# Patient Record
Sex: Male | Born: 1955
Health system: Southern US, Community
[De-identification: ages and names within clinical notes are randomized; demographics above are authoritative.]

## PROBLEM LIST (undated history)

## (undated) DIAGNOSIS — M1990 Unspecified osteoarthritis, unspecified site: Secondary | ICD-10-CM

## (undated) DIAGNOSIS — I712 Thoracic aortic aneurysm, without rupture: Secondary | ICD-10-CM

## (undated) DIAGNOSIS — I209 Angina pectoris, unspecified: Secondary | ICD-10-CM

## (undated) DIAGNOSIS — K219 Gastro-esophageal reflux disease without esophagitis: Secondary | ICD-10-CM

## (undated) HISTORY — DX: Thoracic aortic aneurysm, without rupture: I71.2

## (undated) HISTORY — PX: WRIST SURGERY: SHX841

## (undated) HISTORY — PX: HERNIA REPAIR: SHX51

## (undated) HISTORY — PX: KNEE SURGERY: SHX244

## (undated) HISTORY — DX: Gastro-esophageal reflux disease without esophagitis: K21.9

## (undated) HISTORY — PX: SHOULDER SURGERY: SHX246

---

## 2001-12-11 ENCOUNTER — Ambulatory Visit (HOSPITAL_COMMUNITY): Admission: RE | Admit: 2001-12-11 | Discharge: 2001-12-11 | Payer: Self-pay | Admitting: Family Medicine

## 2001-12-11 ENCOUNTER — Encounter: Payer: Self-pay | Admitting: Family Medicine

## 2005-03-31 ENCOUNTER — Ambulatory Visit (HOSPITAL_COMMUNITY): Admission: RE | Admit: 2005-03-31 | Discharge: 2005-03-31 | Payer: Self-pay | Admitting: General Surgery

## 2005-04-05 ENCOUNTER — Ambulatory Visit (HOSPITAL_COMMUNITY): Admission: RE | Admit: 2005-04-05 | Discharge: 2005-04-05 | Payer: Self-pay | Admitting: General Surgery

## 2006-05-11 ENCOUNTER — Ambulatory Visit (HOSPITAL_COMMUNITY): Admission: RE | Admit: 2006-05-11 | Discharge: 2006-05-11 | Payer: Self-pay | Admitting: Family Medicine

## 2008-02-23 DIAGNOSIS — C801 Malignant (primary) neoplasm, unspecified: Secondary | ICD-10-CM

## 2008-02-23 HISTORY — DX: Malignant (primary) neoplasm, unspecified: C80.1

## 2009-11-12 ENCOUNTER — Emergency Department (HOSPITAL_COMMUNITY): Admission: EM | Admit: 2009-11-12 | Discharge: 2009-11-12 | Payer: Self-pay | Admitting: Emergency Medicine

## 2010-05-27 ENCOUNTER — Other Ambulatory Visit: Payer: Self-pay | Admitting: Surgery

## 2010-05-27 ENCOUNTER — Encounter (HOSPITAL_COMMUNITY): Payer: Worker's Compensation

## 2010-05-27 ENCOUNTER — Ambulatory Visit (HOSPITAL_COMMUNITY)
Admission: RE | Admit: 2010-05-27 | Discharge: 2010-05-27 | Disposition: A | Discharge status: Home / Self Care | Payer: Worker's Compensation | Source: Ambulatory Visit | Attending: Surgery | Admitting: Surgery

## 2010-05-27 ENCOUNTER — Other Ambulatory Visit (HOSPITAL_COMMUNITY): Payer: Self-pay | Admitting: Surgery

## 2010-05-27 ENCOUNTER — Other Ambulatory Visit (HOSPITAL_COMMUNITY): Payer: Self-pay

## 2010-05-27 DIAGNOSIS — Z01818 Encounter for other preprocedural examination: Secondary | ICD-10-CM | POA: Insufficient documentation

## 2010-05-27 DIAGNOSIS — Z01810 Encounter for preprocedural cardiovascular examination: Secondary | ICD-10-CM | POA: Insufficient documentation

## 2010-05-27 DIAGNOSIS — Z01812 Encounter for preprocedural laboratory examination: Secondary | ICD-10-CM | POA: Insufficient documentation

## 2010-05-27 LAB — COMPREHENSIVE METABOLIC PANEL
ALT: 14 U/L (ref 0–53)
AST: 13 U/L (ref 0–37)
Albumin: 3.8 g/dL (ref 3.5–5.2)
Alkaline Phosphatase: 107 U/L (ref 39–117)
BUN: 9 mg/dL (ref 6–23)
CO2: 25 mEq/L (ref 19–32)
Calcium: 9.1 mg/dL (ref 8.4–10.5)
Chloride: 106 mEq/L (ref 96–112)
Creatinine, Ser: 1.03 mg/dL (ref 0.4–1.5)
GFR calc Af Amer: >60 mL/min (ref >60)
GFR calc non Af Amer: >60 mL/min (ref >60)
Glucose, Bld: 107 mg/dL — ABNORMAL HIGH (ref 70–99)
Potassium: 3.6 mEq/L (ref 3.5–5.1)
Sodium: 137 mEq/L (ref 135–145)
Total Bilirubin: 0.6 mg/dL (ref 0.3–1.2)
Total Protein: 6.7 g/dL (ref 6.0–8.3)

## 2010-05-27 LAB — SURGICAL PCR SCREEN
MRSA, PCR: NEGATIVE
Staphylococcus aureus: NEGATIVE

## 2010-05-27 LAB — CBC
HCT: 45.2 % (ref 39.0–52.0)
Hemoglobin: 15.3 g/dL (ref 13.0–17.0)
MCH: 29.2 pg (ref 26.0–34.0)
MCHC: 33.8 g/dL (ref 30.0–36.0)
MCV: 86.3 fL (ref 78.0–100.0)
Platelets: 251 10*3/uL (ref 150–400)
RBC: 5.24 MIL/uL (ref 4.22–5.81)
RDW: 12.3 % (ref 11.5–15.5)
WBC: 8.4 10*3/uL (ref 4.0–10.5)

## 2010-05-28 ENCOUNTER — Ambulatory Visit (HOSPITAL_COMMUNITY)
Admission: RE | Admit: 2010-05-28 | Discharge: 2010-05-28 | Disposition: A | Discharge status: Home / Self Care | Payer: Worker's Compensation | Source: Ambulatory Visit | Attending: Surgery | Admitting: Surgery

## 2010-05-28 DIAGNOSIS — J4489 Other specified chronic obstructive pulmonary disease: Secondary | ICD-10-CM | POA: Insufficient documentation

## 2010-05-28 DIAGNOSIS — F17200 Nicotine dependence, unspecified, uncomplicated: Secondary | ICD-10-CM | POA: Insufficient documentation

## 2010-05-28 DIAGNOSIS — J449 Chronic obstructive pulmonary disease, unspecified: Secondary | ICD-10-CM | POA: Insufficient documentation

## 2010-05-28 DIAGNOSIS — D176 Benign lipomatous neoplasm of spermatic cord: Secondary | ICD-10-CM | POA: Insufficient documentation

## 2010-05-28 DIAGNOSIS — K4091 Unilateral inguinal hernia, without obstruction or gangrene, recurrent: Secondary | ICD-10-CM | POA: Insufficient documentation

## 2010-05-28 DIAGNOSIS — K429 Umbilical hernia without obstruction or gangrene: Secondary | ICD-10-CM | POA: Insufficient documentation

## 2010-05-28 DIAGNOSIS — K4090 Unilateral inguinal hernia, without obstruction or gangrene, not specified as recurrent: Secondary | ICD-10-CM | POA: Insufficient documentation

## 2010-06-10 NOTE — Op Note (Signed)
NAME:  RAEQWON, LUX NO.:  [id number]  MEDICAL RECORD NO.:  [id number]           PATIENT TYPE:  O  LOCATION:  DAYL                         FACILITY:  [facility]  PHYSICIAN:  Ardeth Sportsman, MD     DATE OF BIRTH:  [birth year]  DATE OF PROCEDURE:  05/28/2010 DATE OF DISCHARGE:  05/28/2010                              OPERATIVE REPORT   PRIMARY CARE PHYSICIAN:  [facility], Modena Nunnery, physician assistant.  Also, seen by [employer] and [employer].  SURGEON:  Ardeth Sportsman, MD  ASSISTANT:  RN, first assistant.  PREOPERATIVE DIAGNOSES: 1. Left inguinal hernia. 2. Possible right recurrent inguinal hernia.  POSTOPERATIVE DIAGNOSES: 1. Left direct inguinal hernia. 2. Small indirect, greater than direct inguinal hernia recurrences. 3. Umbilical hernia.  PROCEDURE PERFORMED: 1. Laparoscopic left inguinal hernia repair with mesh. 2. Laparoscopic repair of recurrent right inguinal hernia with mesh. 3. Primary umbilical hernia repair.  ANESTHESIA: 1. General anesthesia. 2. Bilateral ilioinguinal/genitofemoral/spermatic cord nerve blocks.  SPECIMENS:  None.  DRAINS:  None.  ESTIMATED BLOOD LOSS:  Minimal.  COMPLICATIONS:  None.  INDICATION:  Mr. Dauber is a 55 year old pleasant gentleman who noted an episode of severe groin pain, especially on the left side, after doing significant lifting.  He was sent to me to have a definite left inguinal hernia repair.  He may have had a subtle defect on the right side as well with a mild discomfort there.  Anatomy and physiology of abdominal formation was discussed and embryology of testicular migration was explained.  Pathophysiology of inguinal herniation with its natural history and risks of incarceration and strangulation were discussed.  Options discussed.  Recommendations made for diagnostic laparoscopic with repair of left, possible right inguinal hernias.  Risks, benefits, and  alternatives were discussed. Questions answered and he agreed to proceed.  OPERATIVE FINDINGS:  He had an obvious direct inguinal hernia on the left side.  It was not incarcerated.  On the right, he had a mesh in place.  The mesh had contracted a little bit around the corners and seem to be covering most of the internal ring, but had a subtle recurrence laterally and a subtle new hernia in the direct space.  He had a 1 cm umbilical hernia through the umbilical stalk.  DESCRIPTION OF PROCEDURE:  Informed consent was confirmed.  The patient underwent general anesthesia without any difficulty.  He had voided just prior to coming to the operating room.  He received IV antibiotics.  He was positioned supine with arms tucked.  His abdomen and mons pubis were clipped, prepped, and draped in a sterile fashion.  Surgical time-out confirmed our plan.  I placed a 10 mm port infraumbilically through a curvilinear incision. I placed it in the preperitoneal plane posterior to the right rectus muscle.  I induced carbon dioxide insufflation.  I freed the peritoneum off bilateral lower quadrants.  I created enough working space such that 5 mm ports could be placed in the right, left, and mid-abdomen.  Attention was turned towards the left side.  I freed the peritoneum off laterally towards the  flank.  I followed the peritoneum up towards the pubic rim.  I could see a swath of fat and peritoneum going up into a space medial to the inferior epigastric, consistent with a probable direct hernia.  After doing some further skeletonization, I could find a direct hernia and gradually reduced that down to find an intact direct hernia sac.  I could see a swath of peritoneum climbing up with the spermatic cord vessels and I skeletonized that off and peeled that back.  It did not get into the internal ring and although it was mildly dilated, there was no definite indirect inguinal hernia.  He had a few cord  lipomas associated, especially with direct hernia, and I morcellated and removed those.  I __________ hernia sac more proximally.  I freed off the psoas and freed off laterally and freed off the peritoneum off the midabdomen as well.  I turned attention towards the right side.  I did dissection in a mirror- image fashion.  However, in this case, the direct space defect was more subtle.  He did have dense adhesions to old mesh that was in the preperitoneal space.  It had contracted into a ball.  I did sharply free the hernia sac off and it have to open hernia sac anteriorly and then trimmed that off the mesh to safely peel that off.  I could see the vas deferens and splenic vessels on the right side and kept those protected as I peeled the hernia sac back off proximally.  I trimmed off excess hernia sac and did a high ligation using 3-0 Vicryl laparoscopic intracorporeal suturing.  I chose Ultra lightweight polypropylene mesh (Ultrapro).  I chose a 15 x 15 cm square.  I cut a slit in it and placed it such that there were overlapping diamonds with a 6 x 6 inch medial and inferior flap resting between the 2 pelvic side walls and the lateral bladder wall.  The medial corners overlapped across the midline.  This provided excellent coverage around the internal rings and direct space hernias and femoral and obturator foramina.  I __________left and evacuated capnoperitoneum and removed the ports.  I closed the fascial defect.  I noted an umbilical hernia through the umbilical stalk and I closed that using 0-Vicryl interrupted stitches x3.  The patient was extubated and taken to the recovery room in stable condition.  I had discussed postoperative care with the patient in the office.  I did discuss it again with him just prior to surgery.  I am going to discuss it with his significant other as well.     Ardeth Sportsman, MD     SCG/MEDQ  D:  05/28/2010  T:  05/29/2010  Job:   [id number]  cc:   [facility]  [employer]  Electronically Signed by Karie Soda MD on 06/10/2010 11:31:27 AM

## 2010-06-23 ENCOUNTER — Encounter (INDEPENDENT_AMBULATORY_CARE_PROVIDER_SITE_OTHER): Payer: Self-pay | Admitting: Surgery

## 2010-07-10 NOTE — H&P (Signed)
NAME:  Matthew Dickerson, Matthew Dickerson               ACCOUNT NO.:  [id number]   MEDICAL RECORD NO.:  [id number]           PATIENT TYPE:  AMB   LOCATION:                                FACILITY:  [facility]   PHYSICIAN:  Dalia Heading, M.D.  DATE OF BIRTH:  1955-07-13   DATE OF ADMISSION:  DATE OF DISCHARGE:  LH                                HISTORY & PHYSICAL   CHIEF COMPLAINT:  Right inguinal hernia.   HISTORY OF PRESENT ILLNESS:  The patient is 55 year old white male who is  referred for evaluation and treatment of right groin pain.  It has been  present for some time and is made worse with straining.  A bulge has been  noted.  No nausea or vomiting has been noted.   PAST MEDICAL HISTORY:  Unremarkable.   PAST SURGICAL HISTORY:  Includes multiple extremity surgeries.   CURRENT MEDICATIONS:  None.   ALLERGIES:  No known drug allergies.   REVIEW OF SYSTEMS:  The patient smokes a pack of cigarettes a day. Denies  any significant alcohol use.  Denies any other cardiopulmonary difficulties  or bleeding disorders.   PHYSICAL EXAMINATION:  GENERAL:  On physical examination, the patient is a  well-developed well-nourished white male in no acute distress.  LUNGS:  Clear to auscultation with equal breath sounds bilaterally.  HEART:  Heart examination reveals a regular rate and rhythm without S3, S4,  or murmurs.  ABDOMEN:  The abdomen is soft and  nondistended.  He is tender in the right  groin region to palpation.  A reducible right inguinal hernia is noted.  No  hepatosplenomegaly or masses are noted.  GENITOURINARY EXAMINATION:  Within normal limits.   IMPRESSION:  Right inguinal hernia.   PLAN:  The patient is scheduled for a right inguinal herniorrhaphy on  March 31, 2005.  The risks and benefits of the procedure including  bleeding, infection, pain, and the possibility of recurrence of the hernia  were fully explained to the patient who gave informed consent.      Dalia Heading, M.D.  Electronically Signed     MAJ/MEDQ  D:  03/25/2005  T:  03/25/2005  Job:  [id number]   cc:   [facility]  Fax: [phone]   Kirk Ruths, M.D.  Fax: [phone]

## 2010-07-10 NOTE — Op Note (Signed)
NAME:  Matthew Dickerson, Matthew Dickerson               ACCOUNT NO.:  [id number]   MEDICAL RECORD NO.:  [id number]          PATIENT TYPE:  AMB   LOCATION:  DAY                           FACILITY:  [facility]   PHYSICIAN:  Dalia Heading, M.D.  DATE OF BIRTH:  01/13/1956   DATE OF PROCEDURE:  04/05/2005  DATE OF DISCHARGE:                                 OPERATIVE REPORT   PREOPERATIVE DIAGNOSIS:  Right inguinal hernia.   POSTOPERATIVE DIAGNOSIS:  Right inguinal hernia, indirect and direct.   PROCEDURE:  Right inguinal herniorrhaphy.   SURGEON:  Dalia Heading, M.D.   ANESTHESIA:  General.   INDICATIONS:  The patient is a 55 year old white male who presents with a  symptomatic right inguinal hernia. The risks and benefits of the procedure  including bleeding, infection, pain, and possibly of the recurrence of the  hernia were fully explained to the patient and gave informed consent.   PROCEDURE NOTE:  The patient was placed in the supine position. After  general anesthesia was administered, the right groin region was prepped and  draped using the usual sterile technique with Betadine. Surgical site  confirmation was performed.   A transverse incision was made in the right groin region down to the  external oblique aponeurosis.  The aponeurosis was was incised to the  external ring. A Penrose drain was placed around the spermatic cord. The vas  deferens was noted within the spermatic cord. The ileal ilioinguinal nerve  was identified and retracted superiorly from the operative field. The  patient was noted to have both an indirect and direct hernia. The indirect  hernia was freed away from the cord and inverted at the peritoneal  reflection. A large polypropylene mesh plug was then placed into this  region.   A smaller direct hernia was found and this was incised at its base and  inverted. A medium-size polypropylene mesh plug was then placed into this  region. Both were secured using 2-0 Novofil  interrupted sutures to the  transversalis fascia. An onlay polypropylene mesh patch was then placed  along the floor of the inguinal canal and secured superiorly to the  conjoined tendon and inferiorly to the shelving edge of Poupart's ligament  using 2-0 Novofil interrupted sutures. The internal ring was recreated using  a 2-0 Novofil interrupted suture. The external oblique aponeurosis was  reapproximated using a 2-0 Vicryl running suture. The subcutaneous layer was  reapproximated using a 3-0 Vicryl interrupted suture. The skin was closed  using a 4-0 Vicryl subcuticular suture. Then 0.5 cm Sensorcaine was  instilled into the surrounding wound. Dermabond was then applied.   All tape and needle counts were correct at the end of the procedure. The  patient was awakened and transferred to PACU in stable condition.   COMPLICATIONS:  None.   SPECIMEN:  None.   BLOOD LOSS:  Minimal.      Dalia Heading, M.D.  Electronically Signed     MAJ/MEDQ  D:  04/05/2005  T:  04/05/2005  Job:  [id number]   cc:   Kirk Ruths, M.D.  Fax:  [phone] 

## 2013-01-22 ENCOUNTER — Other Ambulatory Visit (HOSPITAL_COMMUNITY): Payer: Self-pay | Admitting: Family Medicine

## 2013-01-22 ENCOUNTER — Ambulatory Visit (HOSPITAL_COMMUNITY)
Admission: RE | Admit: 2013-01-22 | Discharge: 2013-01-22 | Disposition: A | Discharge status: Home / Self Care | Payer: PRIVATE HEALTH INSURANCE | Source: Ambulatory Visit | Attending: Family Medicine | Admitting: Family Medicine

## 2013-01-22 DIAGNOSIS — M4180 Other forms of scoliosis, site unspecified: Secondary | ICD-10-CM | POA: Insufficient documentation

## 2013-01-22 DIAGNOSIS — Z0000 Encounter for general adult medical examination without abnormal findings: Secondary | ICD-10-CM

## 2013-01-22 DIAGNOSIS — M5380 Other specified dorsopathies, site unspecified: Secondary | ICD-10-CM | POA: Insufficient documentation

## 2013-01-22 DIAGNOSIS — F17200 Nicotine dependence, unspecified, uncomplicated: Secondary | ICD-10-CM | POA: Insufficient documentation

## 2013-01-22 DIAGNOSIS — J029 Acute pharyngitis, unspecified: Secondary | ICD-10-CM | POA: Insufficient documentation

## 2013-01-22 DIAGNOSIS — R0789 Other chest pain: Secondary | ICD-10-CM | POA: Insufficient documentation

## 2013-02-22 DIAGNOSIS — I739 Peripheral vascular disease, unspecified: Secondary | ICD-10-CM

## 2013-02-22 HISTORY — PX: COLONOSCOPY: SHX174

## 2013-02-22 HISTORY — DX: Peripheral vascular disease, unspecified: I73.9

## 2014-02-22 DIAGNOSIS — I712 Thoracic aortic aneurysm, without rupture, unspecified: Secondary | ICD-10-CM

## 2014-02-22 HISTORY — DX: Thoracic aortic aneurysm, without rupture: I71.2

## 2014-02-22 HISTORY — DX: Thoracic aortic aneurysm, without rupture, unspecified: I71.20

## 2015-12-08 ENCOUNTER — Other Ambulatory Visit (HOSPITAL_COMMUNITY): Payer: Self-pay | Admitting: Internal Medicine

## 2015-12-08 DIAGNOSIS — R918 Other nonspecific abnormal finding of lung field: Secondary | ICD-10-CM

## 2015-12-11 ENCOUNTER — Ambulatory Visit (HOSPITAL_COMMUNITY)
Admission: RE | Admit: 2015-12-11 | Discharge: 2015-12-11 | Disposition: A | Discharge status: Home / Self Care | Payer: BLUE CROSS/BLUE SHIELD | Source: Ambulatory Visit | Attending: Internal Medicine | Admitting: Internal Medicine

## 2015-12-11 DIAGNOSIS — R918 Other nonspecific abnormal finding of lung field: Secondary | ICD-10-CM | POA: Diagnosis not present

## 2015-12-11 DIAGNOSIS — I712 Thoracic aortic aneurysm, without rupture: Secondary | ICD-10-CM | POA: Insufficient documentation

## 2015-12-11 DIAGNOSIS — R938 Abnormal findings on diagnostic imaging of other specified body structures: Secondary | ICD-10-CM | POA: Insufficient documentation

## 2015-12-23 ENCOUNTER — Encounter: Payer: Self-pay | Admitting: Cardiothoracic Surgery

## 2015-12-24 ENCOUNTER — Institutional Professional Consult (permissible substitution) (INDEPENDENT_AMBULATORY_CARE_PROVIDER_SITE_OTHER): Payer: BLUE CROSS/BLUE SHIELD | Admitting: Surgery

## 2015-12-24 ENCOUNTER — Encounter: Payer: Self-pay | Admitting: Cardiothoracic Surgery

## 2015-12-24 VITALS — BP 144/80 | HR 75 | Resp 20 | Ht 72.0 in | Wt 244.0 lb

## 2015-12-24 DIAGNOSIS — I712 Thoracic aortic aneurysm, without rupture: Secondary | ICD-10-CM | POA: Insufficient documentation

## 2015-12-24 DIAGNOSIS — I7121 Aneurysm of the ascending aorta, without rupture: Secondary | ICD-10-CM

## 2015-12-24 NOTE — Progress Notes (Signed)
Cardiothoracic Surgery Consultation   PCP is Jana Half Referring Provider is Dwan Bolt, RN  Chief Complaint  Patient presents with  . Thoracic Aortic Aneurysm    Surgical eval, Chest CT 12/11/15,     HPI:  The patient is a 60 year old smoker who had a chest CT done 12/11/2015 after a CXR on 09/24/2015 showed a possible lung nodule. The CT scan showed an incidental 4.0 cm fusiform ascending aortic aneurysm. There were no lung nodules seen. There was also a small hepatic cyst and a 1 cm left adrenal nodule consistent with an adenoma.  Past Medical History:  Diagnosis Date  . Aortic aneurysm, thoracic ([place])   . GERD (gastroesophageal reflux disease)   On Xarelto for left superficial thrombophlebitis  Past Surgical History:  Procedure Laterality Date  . COLONOSCOPY  2015  . HERNIA REPAIR     RT SIDE  . KNEE SURGERY arthroscopies     RT-2 L-1  . SHOULDER SURGERY     RT  . WRIST SURGERY     RT    Family History  Problem Relation Age of Onset  . Cancer Father   . Heart failure Maternal Grandfather   no family history of aortic aneurysm or dissection  Social History Social History  Substance Use Topics  . Smoking status: Current Every Day Smoker 1 pk per week    Types: Cigarettes  . Smokeless tobacco: Not on file  . Alcohol use Yes    Current Outpatient Prescriptions  Medication Sig Dispense Refill  . Acetaminophen (TYLENOL PO) Take by mouth as needed.      Marland Kitchen omeprazole (PRILOSEC) 20 MG capsule Take 20 mg by mouth daily.    . rivaroxaban (XARELTO) 20 MG TABS tablet Take 20 mg by mouth daily with supper.     No current facility-administered medications for this visit.     No Known Allergies  Review of Systems  Constitutional: Negative.   HENT: Negative.   Eyes: Negative.   Respiratory: Negative.   Cardiovascular: Negative.   Gastrointestinal: Negative.   Endocrine: Negative.   Genitourinary: Negative.   Musculoskeletal: Negative.     Skin: Negative.   Allergic/Immunologic: Negative.   Neurological: Negative.   Hematological: Negative.   Psychiatric/Behavioral: Negative.     BP (!) 144/80   Pulse 75   Resp 20   Ht 6' (1.829 m)   Wt 244 lb (110.7 kg)   SpO2 96% Comment: RA  BMI 33.09 kg/m  Physical Exam  Constitutional: He is oriented to person, place, and time. He appears well-developed and well-nourished. No distress.  HENT:  Head: Normocephalic and atraumatic.  Mouth/Throat: Oropharynx is clear and moist.  Eyes: EOM are normal. Pupils are equal, round, and reactive to light.  Neck: Normal range of motion. Neck supple. No JVD present.  Cardiovascular: Normal rate, regular rhythm, normal heart sounds and intact distal pulses.   No murmur heard. Pulmonary/Chest: Effort normal and breath sounds normal. No respiratory distress.  Abdominal: Soft. Bowel sounds are normal. He exhibits no distension. There is no tenderness.  Musculoskeletal: Normal range of motion. He exhibits no edema.  Lymphadenopathy:    He has no cervical adenopathy.  Neurological: He is alert and oriented to person, place, and time. He has normal strength. No cranial nerve deficit or sensory deficit.    Diagnostic Tests:  CLINICAL DATA:  History of heavy smoking, pulmonary nodular densities seen on recent chest radiograph of 09/24/2015.  EXAM: CT CHEST WITHOUT CONTRAST  TECHNIQUE: Multidetector CT imaging of the chest was performed following the standard protocol without IV contrast.  COMPARISON:  Chest radiograph 09/24/2015  FINDINGS: Cardiovascular: Normal cardiac chambers size. Dense coronary arteriosclerosis. No pericardial effusion or pericardial thickening. 4 cm ascending aortic aneurysm.  Mediastinum/Nodes: No enlarged mediastinal or axillary lymph nodes. Thyroid gland, trachea, and esophagus demonstrate no significant findings.  Lungs/Pleura: 8 mm by 6 mm noncalcified ovoid density along the minor fissure  appears to be a focus of minimal pleural thickening when triangulated on coronal images, series 8, image 83 and series 5, image 38. Bilateral upper lobe predominant small subpleural blebs are noted. No dominant mass, pneumonic consolidation, CHF, effusion or pneumothorax.  Upper Abdomen: 1 1.8 x 1.4 cm circumscribed hypodensity in the left hepatic lobe with water attenuation of Hounsfield units consistent with a cyst. 1 cm left posterior limb adrenal nodule with Hounsfield units suggesting an adenoma (Hounsfield unit 8).  Musculoskeletal: Osteophytes noted along the dorsal spine. No acute osseous appearing abnormality.  IMPRESSION: 1. No pulmonary nodule identified. Minimal pleural thickening along the minor fissure simulating a nodule on the axial views but appears flat along the minor fissure on coronal reformats. 2. 4 cm ascending aortic aneurysm. Recommend semi-annual imaging followup by CTA or MRA and referral to cardiothoracic surgery if not already obtained. This recommendation follows 2010 ACCF/AHA/AATS/ACR/ASA/SCA/SCAI/SIR/STS/SVM Guidelines for the Diagnosis and Management of Patients With Thoracic Aortic Disease. Circulation. 2010; 121: e266-e36 3. 1 cm left adrenal nodule consistent with an adenoma. 4. 1.8 x 1.4 cm left hypodensity consistent with a hepatic cyst   Electronically Signed   By: Ashley Royalty M.D.   On: 12/11/2015 18:50   Impression:  This 60 year old gentleman has a 4.0 cm fusiform ascending aortic aneurysm of unknown duration. This is well below the 5.5 cm threshold for recommending surgical treatment. He should maintain good blood pressure control and will need a repeat CT of the chest in one year. He does have significant calcified plaque in the left main and proximal LCX and LAD coronary arteries. This does not mean that there is significant stenosis but it is a marker for the potential to develop this. He is currently asymptomatic. I reviewed the  CT scan with him and his wife and answered their questions. I strongly advised him to stop smoking and minimize his cardiac risk factors.  Plan:  I will see him back in one year with a CTA of the chest.  I spent 45 minutes performing this consultation and > 50% of this time was spent face to face counseling and coordinating the care of this patient's ascending aortic aneurysm.  Gaye Pollack, MD [facility] [phone]

## 2016-12-16 ENCOUNTER — Other Ambulatory Visit: Payer: Self-pay | Admitting: Surgery

## 2016-12-16 DIAGNOSIS — I712 Thoracic aortic aneurysm, without rupture, unspecified: Secondary | ICD-10-CM

## 2017-01-05 ENCOUNTER — Ambulatory Visit: Payer: Self-pay | Admitting: Surgery

## 2017-01-05 ENCOUNTER — Other Ambulatory Visit: Payer: Self-pay

## 2017-01-10 ENCOUNTER — Ambulatory Visit
Admission: RE | Admit: 2017-01-10 | Discharge: 2017-01-10 | Disposition: A | Discharge status: Home / Self Care | Payer: BLUE CROSS/BLUE SHIELD | Source: Ambulatory Visit | Attending: Surgery | Admitting: Surgery

## 2017-01-10 DIAGNOSIS — I712 Thoracic aortic aneurysm, without rupture, unspecified: Secondary | ICD-10-CM

## 2017-01-10 MED ORDER — IOPAMIDOL (ISOVUE-370) INJECTION 76%
75.0000 mL | Freq: Once | INTRAVENOUS | Status: AC | PRN
  Administered 2017-01-10: 75 mL via INTRAVENOUS

## 2017-01-12 ENCOUNTER — Ambulatory Visit: Payer: Self-pay | Admitting: Surgery

## 2017-01-26 ENCOUNTER — Other Ambulatory Visit: Payer: Self-pay

## 2017-01-26 ENCOUNTER — Ambulatory Visit (INDEPENDENT_AMBULATORY_CARE_PROVIDER_SITE_OTHER): Payer: BLUE CROSS/BLUE SHIELD | Admitting: Surgery

## 2017-01-26 ENCOUNTER — Encounter: Payer: Self-pay | Admitting: Surgery

## 2017-01-26 VITALS — BP 132/84 | HR 74 | Ht 72.0 in | Wt 229.0 lb

## 2017-01-26 DIAGNOSIS — I712 Thoracic aortic aneurysm, without rupture, unspecified: Secondary | ICD-10-CM

## 2017-01-28 ENCOUNTER — Encounter: Payer: Self-pay | Admitting: Surgery

## 2017-01-28 NOTE — Progress Notes (Signed)
     HPI:  The patient returns for follow up of a 4.0 cm ascending aortic aneurysm found incidentally on a CT scan of the chest done 12/11/2015 for work up of a lung nodule seen on CXR. He has felt well over the past year with no chest or back pain.  No current outpatient medications on file.   No current facility-administered medications for this visit.      Physical Exam: BP 132/84 (BP Location: Right Arm, Patient Position: Sitting, Cuff Size: Large)   Pulse 74   Ht 6' (1.829 m)   Wt 229 lb (103.9 kg)   SpO2 98% Comment: on RA  BMI 31.06 kg/m  He looks well Cardiac exam shows a regular rate and rhythm with normal heart sounds Lungs are clear  Diagnostic Tests:  CLINICAL DATA:  Thoracic aortic aneurysm without rupture.  EXAM: CT ANGIOGRAPHY CHEST WITH CONTRAST  TECHNIQUE: Multidetector CT imaging of the chest was performed using the standard protocol during bolus administration of intravenous contrast. Multiplanar CT image reconstructions and MIPs were obtained to evaluate the vascular anatomy.  CONTRAST:  39mL ISOVUE-370 IOPAMIDOL (ISOVUE-370) INJECTION 76%  COMPARISON:  CT scan of December 11, 2015.  FINDINGS: Cardiovascular: 4.2 cm ascending thoracic aortic aneurysm is noted without dissection. Great vessels are widely patent. Transverse aortic arch measures 2.8 cm 3 proximal descending thoracic aorta measures 2.9 cm. Normal cardiac size. No pericardial effusion.  Mediastinum/Nodes: No enlarged mediastinal, hilar, or axillary lymph nodes. Thyroid gland, trachea, and esophagus demonstrate no significant findings.  Lungs/Pleura: No pneumothorax or pleural effusion is noted. Stable nodular density seen in right middle lobe most consistent with pleural thickening. No significant pulmonary parenchymal abnormality is noted. Stable bilateral apical blebs are noted.  Upper Abdomen: Stable left hepatic cyst. Stable left adrenal adenoma.  Musculoskeletal:  No chest wall abnormality. No acute or significant osseous findings.  Review of the MIP images confirms the above findings.  IMPRESSION: Stable 4.2 cm ascending thoracic aortic aneurysm. Recommend annual imaging followup by CTA or MRA. This recommendation follows 2010 ACCF/AHA/AATS/ACR/ASA/SCA/SCAI/SIR/STS/SVM Guidelines for the Diagnosis and Management of Patients with Thoracic Aortic Disease. Circulation. 2010; 121: [id number].  Stable small blebs seen in both lung apices.  Stable left adrenal adenoma.   Electronically Signed   By: Marijo Conception, M.D.   On: 01/10/2017 16:00   Impression:  His ascending aortic aneurysm was measured at 4.2 cm now compared to 4.0 cm a year ago. I have personally reviewed the CT scan and compared it to last years scan. The aorta is probably about the same. I would check it again in a year. His BP is adequately controlled. I reviewed his CT scan images with him and answered his questions.  Plan:  Follow up with me in one year with a CTA of the chest.  I spent 15 minutes performing this established patient evaluation and > 50% of this time was spent face to face counseling and coordinating the care of this patient's aortic aneurysm.  Gaye Pollack, MD [facility] [phone]

## 2018-01-03 ENCOUNTER — Other Ambulatory Visit: Payer: Self-pay | Admitting: Surgery

## 2018-01-03 DIAGNOSIS — I712 Thoracic aortic aneurysm, without rupture, unspecified: Secondary | ICD-10-CM

## 2018-01-03 NOTE — Progress Notes (Unsigned)
Ct a 

## 2018-03-01 ENCOUNTER — Ambulatory Visit: Payer: Self-pay | Admitting: Surgery

## 2018-03-01 ENCOUNTER — Other Ambulatory Visit: Payer: Self-pay

## 2018-08-04 ENCOUNTER — Ambulatory Visit: Payer: Self-pay | Admitting: Orthopedic Surgery

## 2018-08-18 ENCOUNTER — Encounter (INDEPENDENT_AMBULATORY_CARE_PROVIDER_SITE_OTHER): Payer: BC Managed Care – PPO | Admitting: Orthopedic Surgery

## 2018-08-18 ENCOUNTER — Other Ambulatory Visit: Payer: Self-pay

## 2018-08-18 ENCOUNTER — Ambulatory Visit (INDEPENDENT_AMBULATORY_CARE_PROVIDER_SITE_OTHER): Payer: BC Managed Care – PPO

## 2018-08-18 VITALS — BP 166/89 | HR 73 | Temp 97.3°F | Ht 72.0 in | Wt 232.0 lb

## 2018-08-18 DIAGNOSIS — G8929 Other chronic pain: Secondary | ICD-10-CM

## 2018-08-18 DIAGNOSIS — F17200 Nicotine dependence, unspecified, uncomplicated: Secondary | ICD-10-CM

## 2018-08-18 DIAGNOSIS — M25561 Pain in right knee: Secondary | ICD-10-CM | POA: Diagnosis not present

## 2018-08-18 DIAGNOSIS — M25562 Pain in left knee: Secondary | ICD-10-CM | POA: Diagnosis not present

## 2018-08-18 NOTE — Patient Instructions (Signed)
Coping with Quitting Smoking  Quitting smoking is a physical and mental challenge. You will face cravings, withdrawal symptoms, and temptation. Before quitting, work with your health care provider to make a plan that can help you cope. Preparation can help you quit and keep you from giving in. How can I cope with cravings? Cravings usually last for 5-10 minutes. If you get through it, the craving will pass. Consider taking the following actions to help you cope with cravings:  Keep your mouth busy: ? Chew sugar-free gum. ? Suck on hard candies or a straw. ? Brush your teeth.  Keep your hands and body busy: ? Immediately change to a different activity when you feel a craving. ? Squeeze or play with a ball. ? Do an activity or a hobby, like making bead jewelry, practicing needlepoint, or working with wood. ? Mix up your normal routine. ? Take a short exercise break. Go for a quick walk or run up and down stairs. ? Spend time in public places where smoking is not allowed.  Focus on doing something kind or helpful for someone else.  Call a friend or family member to talk during a craving.  Join a support group.  Call a quit line, such as 1-800-QUIT-NOW.  Talk with your health care provider about medicines that might help you cope with cravings and make quitting easier for you. How can I deal with withdrawal symptoms? Your body may experience negative effects as it tries to get used to not having nicotine in the system. These effects are called withdrawal symptoms. They may include:  Feeling hungrier than normal.  Trouble concentrating.  Irritability.  Trouble sleeping.  Feeling depressed.  Restlessness and agitation.  Craving a cigarette. To manage withdrawal symptoms:  Avoid places, people, and activities that trigger your cravings.  Remember why you want to quit.  Get plenty of sleep.  Avoid coffee and other caffeinated drinks. These may worsen some of your symptoms.  How can I handle social situations? Social situations can be difficult when you are quitting smoking, especially in the first few weeks. To manage this, you can:  Avoid parties, bars, and other social situations where people might be smoking.  Avoid alcohol.  Leave right away if you have the urge to smoke.  Explain to your family and friends that you are quitting smoking. Ask for understanding and support.  Plan activities with friends or family where smoking is not an option. What are some ways I can cope with stress? Wanting to smoke may cause stress, and stress can make you want to smoke. Find ways to manage your stress. Relaxation techniques can help. For example:  Breathe slowly and deeply, in through your nose and out through your mouth.  Listen to soothing, relaxing music.  Talk with a family member or friend about your stress.  Light a candle.  Soak in a bath or take a shower.  Think about a peaceful place. What are some ways I can prevent weight gain? Be aware that many people gain weight after they quit smoking. However, not everyone does. To keep from gaining weight, have a plan in place before you quit and stick to the plan after you quit. Your plan should include:  Having healthy snacks. When you have a craving, it may help to: ? Eat plain popcorn, crunchy carrots, celery, or other cut vegetables. ? Chew sugar-free gum.  Changing how you eat: ? Eat small portion sizes at meals. ? Eat 4-6 small meals   throughout the day instead of 1-2 large meals a day. ? Be mindful when you eat. Do not watch television or do other things that might distract you as you eat.  Exercising regularly: ? Make time to exercise each day. If you do not have time for a long workout, do short bouts of exercise for 5-10 minutes several times a day. ? Do some form of strengthening exercise, like weight lifting, and some form of aerobic exercise, like running or swimming.  Drinking plenty of  water or other low-calorie or no-calorie drinks. Drink 6-8 glasses of water daily, or as much as instructed by your health care provider. Summary  Quitting smoking is a physical and mental challenge. You will face cravings, withdrawal symptoms, and temptation to smoke again. Preparation can help you as you go through these challenges.  You can cope with cravings by keeping your mouth busy (such as by chewing gum), keeping your body and hands busy, and making calls to family, friends, or a helpline for people who want to quit smoking.  You can cope with withdrawal symptoms by avoiding places where people smoke, avoiding drinks with caffeine, and getting plenty of rest.  Ask your health care provider about the different ways to prevent weight gain, avoid stress, and handle social situations. This information is not intended to replace advice given to you by your health care provider. Make sure you discuss any questions you have with your health care provider. Document Released: 02/06/2016 Document Revised: 02/06/2016 Document Reviewed: 02/06/2016 Elsevier Interactive Patient Education  2019 [employer]  

## 2018-08-18 NOTE — Progress Notes (Signed)
  NEW PROBLEM OFFICE VISIT  Chief Complaint  Patient presents with  . Knee Pain    Bilateral knee pain.  . Error    63 year old male bilateral knee pain bilateral x-rays were obtained I do not recall the details of this office visit   ROS   Past Medical History:  Diagnosis Date  . Aortic aneurysm, thoracic ([place])   . GERD (gastroesophageal reflux disease)     Past Surgical History:  Procedure Laterality Date  . COLONOSCOPY  2015  . HERNIA REPAIR     RT SIDE  . KNEE SURGERY     RT-2 L-1  . SHOULDER SURGERY     RT  . WRIST SURGERY     RT    Family History  Problem Relation Age of Onset  . Cancer Father   . Heart failure Maternal Grandfather    Social History   Tobacco Use  . Smoking status: Current Every Day Smoker    Packs/day: 0.75    Years: 51.00    Pack years: 38.25    Types: Cigarettes  . Smokeless tobacco: Never Used  Substance Use Topics  . Alcohol use: Yes  . Drug use: Yes    Types: Marijuana    No Known Allergies  No outpatient medications have been marked as taking for the 08/18/18 encounter (Office Visit) with Carole Civil, MD.    BP (!) 166/89   Pulse 73   Temp (!) 97.3 F (36.3 C)   Ht 6' (1.829 m)   Wt 232 lb (105.2 kg)   BMI 31.46 kg/m   Physical Exam  Ortho Exam    MEDICAL DECISION SECTION  Xrays were done at [facility] orthopedics  My independent reading of xrays:  See radiographs report  [facility] [place] imaging x-rays of the left knee   AP left knee bilateral shows severe narrowing of the medial compartment sclerosis mild to moderate secondary bone changes on axial view patella is aligned normally   Impression severe arthritis with varus moderate deformity left knee Bilateral knee x-rays taken at [facility] [place] the AP x-rays are on the left knee film series   Right knee varus alignment joint space narrowing medially no significant secondary bone changes   Impression moderate arthritis with  varus alignment to the right knee   Encounter Diagnoses  Name Primary?  . Smoking addiction Yes  . Chronic pain of both knees   . Erroneous encounter - disregard     PLAN: (Rx., injectx, surgery, frx, mri/ct) Again details of visit has been lost not sure what was done in this case patient was advised to quit smoking may have been given injections no charges will be filed  No orders of the defined types were placed in this encounter.   Arther Abbott, MD  09/11/2018 4:24 PM

## 2018-09-01 ENCOUNTER — Ambulatory Visit: Payer: Self-pay | Admitting: Orthopedic Surgery

## 2018-09-08 NOTE — Progress Notes (Signed)
This encounter was created in error - please disregard.

## 2018-09-26 ENCOUNTER — Other Ambulatory Visit: Payer: Self-pay

## 2018-09-26 DIAGNOSIS — Z20822 Contact with and (suspected) exposure to covid-19: Secondary | ICD-10-CM

## 2018-09-27 LAB — NOVEL CORONAVIRUS, NAA: SARS-CoV-2, NAA: NOT DETECTED

## 2018-09-28 ENCOUNTER — Telehealth: Payer: Self-pay

## 2018-09-28 NOTE — Telephone Encounter (Signed)
Pt. Called back, given COVID 19 results.

## 2019-01-25 ENCOUNTER — Other Ambulatory Visit: Payer: Self-pay | Admitting: Surgery

## 2019-01-25 DIAGNOSIS — I712 Thoracic aortic aneurysm, without rupture, unspecified: Secondary | ICD-10-CM

## 2019-02-05 ENCOUNTER — Other Ambulatory Visit: Payer: Self-pay

## 2019-02-05 ENCOUNTER — Ambulatory Visit (INDEPENDENT_AMBULATORY_CARE_PROVIDER_SITE_OTHER): Payer: BC Managed Care – PPO | Admitting: Orthopedic Surgery

## 2019-02-05 ENCOUNTER — Encounter: Payer: Self-pay | Admitting: Orthopedic Surgery

## 2019-02-05 VITALS — BP 143/86 | HR 104 | Ht 72.0 in | Wt 234.0 lb

## 2019-02-05 DIAGNOSIS — M170 Bilateral primary osteoarthritis of knee: Secondary | ICD-10-CM | POA: Diagnosis not present

## 2019-02-05 MED ORDER — MELOXICAM 7.5 MG PO TABS: 7.5000 mg | ORAL_TABLET | Freq: Every day | ORAL | 5 refills | Status: DC

## 2019-02-05 NOTE — Patient Instructions (Signed)

## 2019-02-05 NOTE — Progress Notes (Signed)
Matthew Dickerson  02/05/2019  Body mass index is 31.74 kg/m.   HISTORY SECTION :  Chief Complaint  Patient presents with  . Knee Pain    bilateral knees pain since 1992    This is a 63 year old male with a 4.0 cm abdominal aortic aneurysm, history of smoking, presents with history of 3 arthroscopies in the right knee 2 in the left complaining of bilateral knee pain left worse than right with progressive varus deformity.  He describes his pain as moderate to severe and as a dull ache worse with weightbearing  No prior treatment  Review of Systems  Respiratory: Negative for shortness of breath.   Cardiovascular: Negative for chest pain.  Neurological: Positive for tingling.     has a past medical history of Aortic aneurysm, thoracic ([facility]) and GERD (gastroesophageal reflux disease).   Past Surgical History:  Procedure Laterality Date  . COLONOSCOPY  2015  . HERNIA REPAIR     RT SIDE  . KNEE SURGERY     RT-2 L-1  . SHOULDER SURGERY     RT  . WRIST SURGERY     RT    Body mass index is 31.74 kg/m.   No Known Allergies   Current Outpatient Medications:  .  meloxicam (MOBIC) 7.5 MG tablet, Take 1 tablet (7.5 mg total) by mouth daily., Disp: 30 tablet, Rfl: 5   PHYSICAL EXAM SECTION: 1) BP (!) 143/86   Pulse (!) 104   Ht 6' (1.829 m)   Wt 234 lb (106.1 kg)   BMI 31.74 kg/m   Body mass index is 31.74 kg/m. General appearance: Well-developed well-nourished no gross deformities  2) Cardiovascular normal pulse and perfusion in the lower  extremities normal color without edema  3) Neurologically deep tendon reflexes are equal and normal, no sensation loss or deficits no pathologic reflexes  4) Psychological: Awake alert and oriented x3 mood and affect normal  5) Skin no lacerations or ulcerations no nodularity no palpable masses, no erythema or nodularity  6) Musculoskeletal:   Right Knee Exam   Muscle Strength  The patient has normal right knee  strength.  Tenderness  The patient is experiencing tenderness in the medial joint line.  Range of Motion  Extension: 5  Right knee flexion: 125.   Tests  McMurray:  Medial - negative  Drawer:  Anterior - negative    Posterior - negative  Other  Swelling: none Effusion: no effusion present  Comments:  Varus    Left Knee Exam   Muscle Strength  The patient has normal left knee strength.  Tenderness  The patient is experiencing tenderness in the medial joint line.  Range of Motion  Extension: 5  Left knee flexion: 125.   Tests  McMurray:  Medial - negative  Drawer:  Anterior - negative     Posterior - negative  Other  Swelling: none Effusion: no effusion present  Comments:  Varus        MEDICAL DECISION SECTION:  Encounter Diagnosis  Name Primary?  . Primary osteoarthritis of both knees Yes    Imaging Knee films 6 months ago see report   Bilateral varus knees severe oa both knees   Plan:  (Rx., Inj., surg., Frx, MRI/CT, XR:2)  Procedure note for bilateral knee injections  Procedure note left knee injection verbal consent was obtained to inject left knee joint  Timeout was completed to confirm the site of injection  The medications used were 40 mg of Depo-Medrol and  1% lidocaine 3 cc  Anesthesia was provided by ethyl chloride and the skin was prepped with alcohol.  After cleaning the skin with alcohol a 20-gauge needle was used to inject the left knee joint. There were no complications. A sterile bandage was applied.   Procedure note right knee injection verbal consent was obtained to inject right knee joint  Timeout was completed to confirm the site of injection  The medications used were 40 mg of Depo-Medrol and 1% lidocaine 3 cc  Anesthesia was provided by ethyl chloride and the skin was prepped with alcohol.  After cleaning the skin with alcohol a 20-gauge needle was used to inject the right knee joint. There were no complications. A  sterile bandage was applied.   Meds ordered this encounter  Medications  . meloxicam (MOBIC) 7.5 MG tablet    Sig: Take 1 tablet (7.5 mg total) by mouth daily.    Dispense:  30 tablet    Refill:  5    Fu 6 months   High surgical risks due to the aneurysm the smoking history history of clotting  5:10 PM Arther Abbott, MD  02/05/2019

## 2019-02-23 DIAGNOSIS — I219 Acute myocardial infarction, unspecified: Secondary | ICD-10-CM

## 2019-02-23 HISTORY — DX: Acute myocardial infarction, unspecified: I21.9

## 2019-03-06 ENCOUNTER — Other Ambulatory Visit: Payer: Self-pay | Admitting: Surgery

## 2019-03-06 DIAGNOSIS — I712 Thoracic aortic aneurysm, without rupture, unspecified: Secondary | ICD-10-CM

## 2019-03-07 ENCOUNTER — Other Ambulatory Visit: Payer: Self-pay

## 2019-03-07 ENCOUNTER — Ambulatory Visit
Admission: RE | Admit: 2019-03-07 | Discharge: 2019-03-07 | Disposition: A | Discharge status: Home / Self Care | Payer: BC Managed Care – PPO | Source: Ambulatory Visit | Attending: Surgery | Admitting: Surgery

## 2019-03-07 ENCOUNTER — Ambulatory Visit: Payer: Self-pay | Admitting: Surgery

## 2019-03-07 ENCOUNTER — Other Ambulatory Visit: Payer: Self-pay | Admitting: Surgery

## 2019-03-07 ENCOUNTER — Encounter: Payer: Self-pay | Admitting: Surgery

## 2019-03-07 DIAGNOSIS — I712 Thoracic aortic aneurysm, without rupture, unspecified: Secondary | ICD-10-CM

## 2019-03-08 LAB — CREATININE WITH EST GFR
Creat: 1.07 mg/dL (ref 0.70–1.25)
GFR, Est African American: 85 mL/min/{1.73_m2} (ref >=60)
GFR, Est Non African American: 73 mL/min/{1.73_m2} (ref >=60)

## 2019-03-08 LAB — BUN: BUN: 13 mg/dL (ref 7–25)

## 2019-03-14 ENCOUNTER — Ambulatory Visit
Admission: RE | Admit: 2019-03-14 | Discharge: 2019-03-14 | Disposition: A | Discharge status: Home / Self Care | Payer: BC Managed Care – PPO | Source: Ambulatory Visit | Attending: Surgery | Admitting: Surgery

## 2019-03-14 ENCOUNTER — Ambulatory Visit (INDEPENDENT_AMBULATORY_CARE_PROVIDER_SITE_OTHER): Payer: BC Managed Care – PPO | Admitting: Surgery

## 2019-03-14 ENCOUNTER — Encounter: Payer: Self-pay | Admitting: Surgery

## 2019-03-14 ENCOUNTER — Encounter: Payer: Self-pay | Admitting: *Deleted

## 2019-03-14 ENCOUNTER — Other Ambulatory Visit: Payer: Self-pay

## 2019-03-14 VITALS — BP 157/80 | HR 80 | Resp 18 | Ht 72.0 in | Wt 232.0 lb

## 2019-03-14 DIAGNOSIS — I712 Thoracic aortic aneurysm, without rupture, unspecified: Secondary | ICD-10-CM

## 2019-03-14 MED ORDER — IOPAMIDOL (ISOVUE-370) INJECTION 76%
75.0000 mL | Freq: Once | INTRAVENOUS | Status: AC | PRN
  Administered 2019-03-14: 75 mL via INTRAVENOUS

## 2019-03-14 NOTE — Progress Notes (Signed)
HPI:  The patient returns today for follow-up of a small ascending aortic aneurysm that was measured at 4.0 cm in 2017 and 4.2 cm in 2018.  I was supposed see him back last year for follow-up but he said that he changed jobs and had to skip his appointment.  Since I last saw him 2 years ago he has been feeling well.  He denies any chest or back pain.  He continues to smoke intermittently.  Current Outpatient Medications  Medication Sig Dispense Refill  . meloxicam (MOBIC) 7.5 MG tablet Take 1 tablet (7.5 mg total) by mouth daily. 30 tablet 5   No current facility-administered medications for this visit.     Physical Exam: BP (!) 157/80   Pulse 80   Resp 18   Ht 6' (1.829 m)   Wt 232 lb (105.2 kg)   SpO2 97% Comment: on RA  BMI 31.46 kg/m  He looks well. Cardiac exam shows a regular rate and rhythm with normal heart sounds. Lungs are clear.  Diagnostic Tests:  CLINICAL DATA:  Follow-up thoracic aortic aneurysm  EXAM: CT ANGIOGRAPHY CHEST WITH CONTRAST  TECHNIQUE: Multidetector CT imaging of the chest was performed using the standard protocol during bolus administration of intravenous contrast. Multiplanar CT image reconstructions and MIPs were obtained to evaluate the vascular anatomy.  CONTRAST:  68mL ISOVUE-370 IOPAMIDOL (ISOVUE-370) INJECTION 76%  COMPARISON:  01/10/2017  FINDINGS: Cardiovascular: Preferential opacification of the thoracic aorta. No thoracic aortic dissection. Ascending thoracic aorta measures 4.2 cm in maximum diameter at the level of the right main pulmonary artery. Aortic arch measures 2.8 cm in diameter. Proximal descending aorta at the level of the left main pulmonary artery measures 2.8 cm in diameter. Major vessels arising from the aortic arch are patent. Mild thoracic aortic atherosclerosis. Normal heart size. No pericardial effusion. Coronary artery atherosclerosis.  Mediastinum/Nodes: No enlarged mediastinal, hilar, or  axillary lymph nodes. Thyroid gland, trachea, and esophagus demonstrate no significant findings.  Lungs/Pleura: 7 mm nodular thickening along the minor fissure likely postinflammatory and unchanged compared with 12/11/2015. No focal consolidation, pleural effusion or pneumothorax.  Upper Abdomen: No acute abnormality.  Musculoskeletal: No chest wall abnormality. No acute or significant osseous findings.  Review of the MIP images confirms the above findings.  IMPRESSION: 1. Ascending thoracic aortic aneurysm measuring 4.2 cm. Recommend annual imaging followup by CTA or MRA. This recommendation follows 2010 ACCF/AHA/AATS/ACR/ASA/SCA/SCAI/SIR/STS/SVM Guidelines for the Diagnosis and Management of Patients with Thoracic Aortic Disease. Circulation. 2010; [id number]. Aortic aneurysm NOS (ICD10-I71.9) 2. No thoracic aortic dissection. 3.   Aortic Atherosclerosis (ICD10-I70.0).   Electronically Signed   By: Kathreen Devoid   On: 03/14/2019 12:41   Impression:  This 64 year old gentleman has a stable 4.2 cm fusiform ascending aortic aneurysm.  It is still well below the surgical threshold of 5.5 cm.  I reviewed the CT images with the patient and his wife and answered their questions.  I stressed the importance of good blood pressure control and preventing further enlargement and acute aortic dissection.  His blood pressure is mildly elevated today and he is on no medication.  I advised him to check his blood pressure at home periodically and to keep a chart so that when he returns to see his PCP a decision can made about the need for antihypertensive therapy.  I advised him against doing heavy lifting of more than 35 pounds that might lead to a Valsalva maneuver and sudden rise in his blood pressure.  He requested a note for his employer stating the same.  I recommend that he have a follow-up CT scan in 1 year.  Plan:  He will return to see me in 1 year with a CTA of the  chest.  I spent 15 minutes performing this established patient evaluation and > 50% of this time was spent face to face counseling and coordinating the care of this patient's aortic aneurysm.    Gaye Pollack, MD [facility] [phone]

## 2019-04-02 ENCOUNTER — Other Ambulatory Visit: Payer: Self-pay

## 2019-04-02 ENCOUNTER — Ambulatory Visit: Payer: BC Managed Care – PPO | Attending: Internal Medicine

## 2019-04-02 DIAGNOSIS — Z20822 Contact with and (suspected) exposure to covid-19: Secondary | ICD-10-CM | POA: Diagnosis not present

## 2019-04-03 LAB — NOVEL CORONAVIRUS, NAA: SARS-CoV-2, NAA: NOT DETECTED

## 2019-05-07 ENCOUNTER — Telehealth: Payer: Self-pay | Admitting: Orthopedic Surgery

## 2019-05-07 NOTE — Telephone Encounter (Signed)
Called patient to notify his form for disability parking placard is ready. No answer at home phone; voice mail full on cell number; unable to leave message.

## 2019-07-17 ENCOUNTER — Telehealth: Payer: Self-pay | Admitting: Orthopedic Surgery

## 2019-07-17 NOTE — Telephone Encounter (Signed)
Patient's designated contact on file, spouse Magda Paganini, inquired about picking up films and reports to send to another provider. Discussed process of patient signing authorization form, however, due to patient needing to request films at [facility] radiology department, patient will likely take care of all there.

## 2019-07-24 DIAGNOSIS — G8929 Other chronic pain: Secondary | ICD-10-CM | POA: Insufficient documentation

## 2019-07-24 DIAGNOSIS — M170 Bilateral primary osteoarthritis of knee: Secondary | ICD-10-CM | POA: Diagnosis not present

## 2019-08-04 DIAGNOSIS — Z0001 Encounter for general adult medical examination with abnormal findings: Secondary | ICD-10-CM | POA: Diagnosis not present

## 2019-08-06 ENCOUNTER — Ambulatory Visit: Payer: BC Managed Care – PPO | Admitting: Orthopedic Surgery

## 2019-08-06 DIAGNOSIS — Z0001 Encounter for general adult medical examination with abnormal findings: Secondary | ICD-10-CM | POA: Diagnosis not present

## 2019-08-06 DIAGNOSIS — J019 Acute sinusitis, unspecified: Secondary | ICD-10-CM | POA: Diagnosis not present

## 2019-08-06 DIAGNOSIS — M25569 Pain in unspecified knee: Secondary | ICD-10-CM | POA: Diagnosis not present

## 2019-08-06 DIAGNOSIS — Z0000 Encounter for general adult medical examination without abnormal findings: Secondary | ICD-10-CM | POA: Diagnosis not present

## 2019-08-09 DIAGNOSIS — M25562 Pain in left knee: Secondary | ICD-10-CM | POA: Diagnosis not present

## 2019-08-09 DIAGNOSIS — M179 Osteoarthritis of knee, unspecified: Secondary | ICD-10-CM | POA: Diagnosis not present

## 2019-09-15 ENCOUNTER — Encounter (HOSPITAL_COMMUNITY): Payer: Self-pay | Admitting: *Deleted

## 2019-09-15 ENCOUNTER — Emergency Department (HOSPITAL_COMMUNITY)
Admission: EM | Admit: 2019-09-15 | Discharge: 2019-09-15 | Disposition: A | Discharge status: Home / Self Care | Payer: BC Managed Care – PPO | Attending: Emergency Medicine | Admitting: Emergency Medicine

## 2019-09-15 ENCOUNTER — Other Ambulatory Visit: Payer: Self-pay

## 2019-09-15 ENCOUNTER — Emergency Department (HOSPITAL_COMMUNITY): Payer: BC Managed Care – PPO

## 2019-09-15 DIAGNOSIS — Y929 Unspecified place or not applicable: Secondary | ICD-10-CM | POA: Diagnosis not present

## 2019-09-15 DIAGNOSIS — F1210 Cannabis abuse, uncomplicated: Secondary | ICD-10-CM | POA: Diagnosis not present

## 2019-09-15 DIAGNOSIS — S50812A Abrasion of left forearm, initial encounter: Secondary | ICD-10-CM | POA: Diagnosis not present

## 2019-09-15 DIAGNOSIS — S0003XA Contusion of scalp, initial encounter: Secondary | ICD-10-CM | POA: Insufficient documentation

## 2019-09-15 DIAGNOSIS — M542 Cervicalgia: Secondary | ICD-10-CM | POA: Diagnosis not present

## 2019-09-15 DIAGNOSIS — Y999 Unspecified external cause status: Secondary | ICD-10-CM | POA: Diagnosis not present

## 2019-09-15 DIAGNOSIS — S199XXA Unspecified injury of neck, initial encounter: Secondary | ICD-10-CM | POA: Diagnosis not present

## 2019-09-15 DIAGNOSIS — T07XXXA Unspecified multiple injuries, initial encounter: Secondary | ICD-10-CM

## 2019-09-15 DIAGNOSIS — Y9355 Activity, bike riding: Secondary | ICD-10-CM | POA: Diagnosis not present

## 2019-09-15 DIAGNOSIS — F17210 Nicotine dependence, cigarettes, uncomplicated: Secondary | ICD-10-CM | POA: Insufficient documentation

## 2019-09-15 DIAGNOSIS — S0000XA Unspecified superficial injury of scalp, initial encounter: Secondary | ICD-10-CM | POA: Diagnosis not present

## 2019-09-15 DIAGNOSIS — S0990XA Unspecified injury of head, initial encounter: Secondary | ICD-10-CM | POA: Insufficient documentation

## 2019-09-15 DIAGNOSIS — S0001XA Abrasion of scalp, initial encounter: Secondary | ICD-10-CM | POA: Diagnosis not present

## 2019-09-15 DIAGNOSIS — S40812A Abrasion of left upper arm, initial encounter: Secondary | ICD-10-CM | POA: Diagnosis not present

## 2019-09-15 MED ORDER — BACITRACIN ZINC 500 UNIT/GM EX OINT
TOPICAL_OINTMENT | Freq: Once | CUTANEOUS | Status: AC
Start: 2019-09-15 — End: 2019-09-15
  Administered 2019-09-15: 1 via TOPICAL
  Filled 2019-09-15: qty 0.9
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
## 2019-09-15 NOTE — ED Notes (Signed)
Ice packs given.

## 2019-09-15 NOTE — ED Notes (Signed)
Dressing placed per pa instructions, pt reports 3/10 pain, states that he is ready to go home, pt verbalized d/c instructions and follow up, advised to have someone watch him and to return for concerns, confusion, vomiting or other concerning symptoms.
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
## 2019-09-15 NOTE — Discharge Instructions (Signed)
As we discussed, your CT scan showed a scalp hematoma.  No other bleeding in the brain or any other injury.  As we discussed, you could still have concussion.  This can manifest after head injury includes symptoms such as fatigue, difficulty concentrating, irritability, memory loss.  You should refrain from physical activity for 2 weeks.  He should engage in brain rest which includes limiting amount of screen time, TV, computer time.  Make sure you are getting plenty of rest.  Concussion symptoms can last anywhere between 2 to 4 weeks.  If they last longer, provided you for referral to concussion clinic.  Follow-up with your primary care doctor in 2 to 3 days.  Return the emergency department for any worsening pain, vomiting, numbness/weakness of arms or legs, difficulty walking or any other worsening or concerning symptoms.
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
## 2019-09-21 DIAGNOSIS — S060X9D Concussion with loss of consciousness of unspecified duration, subsequent encounter: Secondary | ICD-10-CM | POA: Diagnosis not present

## 2019-09-21 DIAGNOSIS — Z0189 Encounter for other specified special examinations: Secondary | ICD-10-CM | POA: Diagnosis not present

## 2019-10-12 DIAGNOSIS — S060X9D Concussion with loss of consciousness of unspecified duration, subsequent encounter: Secondary | ICD-10-CM | POA: Diagnosis not present

## 2019-10-17 ENCOUNTER — Encounter: Payer: Self-pay | Admitting: Neurology

## 2019-11-22 DIAGNOSIS — M25561 Pain in right knee: Secondary | ICD-10-CM | POA: Diagnosis not present

## 2019-11-22 DIAGNOSIS — M25562 Pain in left knee: Secondary | ICD-10-CM | POA: Diagnosis not present

## 2019-11-22 DIAGNOSIS — G8929 Other chronic pain: Secondary | ICD-10-CM | POA: Diagnosis not present

## 2019-12-13 DIAGNOSIS — S060X9D Concussion with loss of consciousness of unspecified duration, subsequent encounter: Secondary | ICD-10-CM | POA: Diagnosis not present

## 2019-12-13 DIAGNOSIS — M545 Low back pain, unspecified: Secondary | ICD-10-CM | POA: Diagnosis not present

## 2020-01-03 ENCOUNTER — Encounter: Payer: Self-pay | Admitting: Neurology

## 2020-01-03 ENCOUNTER — Other Ambulatory Visit: Payer: Self-pay

## 2020-01-03 ENCOUNTER — Telehealth: Payer: Self-pay

## 2020-01-03 ENCOUNTER — Ambulatory Visit (INDEPENDENT_AMBULATORY_CARE_PROVIDER_SITE_OTHER): Payer: BC Managed Care – PPO | Admitting: Neurology

## 2020-01-03 VITALS — BP 177/70 | HR 72 | Ht 72.0 in | Wt 233.8 lb

## 2020-01-03 DIAGNOSIS — Z87820 Personal history of traumatic brain injury: Secondary | ICD-10-CM | POA: Diagnosis not present

## 2020-01-03 NOTE — Telephone Encounter (Signed)
Pt forgot to ask Dr. Tomi Likens a question during his visit.  Pt wanted to know if he is agreeable with DR. Walker Kehr that he can go back to work on November 30th?

## 2020-01-03 NOTE — Patient Instructions (Signed)
I do not see any residual neurologic symptoms.  Exam is normal.
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
## 2020-01-03 NOTE — Telephone Encounter (Signed)
Yes, I think he can go back to work on November 30.

## 2020-01-21 NOTE — Telephone Encounter (Signed)
Patient's wife called in and needs a letter stating the patient can come back to work on 01/22/20. It needs to be faxed to [phone].

## 2020-01-23 ENCOUNTER — Other Ambulatory Visit (HOSPITAL_COMMUNITY): Payer: Self-pay | Admitting: Chiropractic Medicine

## 2020-02-04 ENCOUNTER — Other Ambulatory Visit: Payer: Self-pay | Admitting: Surgery

## 2020-02-04 DIAGNOSIS — I712 Thoracic aortic aneurysm, without rupture, unspecified: Secondary | ICD-10-CM

## 2020-03-17 ENCOUNTER — Other Ambulatory Visit: Payer: Self-pay | Admitting: Orthopedic Surgery

## 2020-03-17 NOTE — Telephone Encounter (Signed)
Rx request 

## 2020-03-19 ENCOUNTER — Ambulatory Visit (INDEPENDENT_AMBULATORY_CARE_PROVIDER_SITE_OTHER): Payer: Medicare Other | Admitting: Surgery

## 2020-03-19 ENCOUNTER — Ambulatory Visit: Payer: Self-pay | Admitting: Surgery

## 2020-03-19 ENCOUNTER — Other Ambulatory Visit: Payer: BC Managed Care – PPO

## 2020-03-19 ENCOUNTER — Ambulatory Visit
Admission: RE | Admit: 2020-03-19 | Discharge: 2020-03-19 | Disposition: A | Discharge status: Home / Self Care | Payer: Medicare Other | Source: Ambulatory Visit | Attending: Surgery | Admitting: Surgery

## 2020-03-19 ENCOUNTER — Other Ambulatory Visit: Payer: Self-pay

## 2020-03-19 ENCOUNTER — Encounter: Payer: Self-pay | Admitting: Surgery

## 2020-03-19 VITALS — BP 140/90 | HR 60 | Temp 97.6°F | Resp 20 | Ht 72.0 in | Wt 232.0 lb

## 2020-03-19 DIAGNOSIS — I712 Thoracic aortic aneurysm, without rupture, unspecified: Secondary | ICD-10-CM

## 2020-03-19 MED ORDER — IOPAMIDOL (ISOVUE-370) INJECTION 76%
75.0000 mL | Freq: Once | INTRAVENOUS | Status: AC | PRN
  Administered 2020-03-19: 75 mL via INTRAVENOUS
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
## 2020-04-02 ENCOUNTER — Ambulatory Visit: Payer: Self-pay | Admitting: Surgery

## 2020-04-03 NOTE — Patient Instructions (Addendum)
DUE TO COVID-19 ONLY ONE VISITOR IS ALLOWED TO COME WITH YOU AND STAY IN THE WAITING ROOM ONLY DURING PRE OP AND PROCEDURE DAY OF SURGERY. THE 1 VISITOR  MAY VISIT WITH YOU AFTER SURGERY IN YOUR PRIVATE ROOM DURING VISITING HOURS ONLY!  YOU NEED TO HAVE A COVID 19 TEST ON_2/17______ @8 :30_______, THIS TEST MUST BE DONE BEFORE SURGERY,  COVID TESTING SITE [address] [place] [address], IT IS ON THE RIGHT GOING OUT [address] APPROXIMATELY  2 MINUTES PAST [employer] ON THE RIGHT. ONCE YOUR COVID TEST IS COMPLETED,  PLEASE BEGIN THE QUARANTINE INSTRUCTIONS AS OUTLINED IN YOUR HANDOUT.                Jeani Sow    Your procedure is scheduled on: 2.21.22   Report to [facility] Main  Entrance   Report to Short stay at 5:30 AM     Call this number if you have problems the morning of surgery [phone]    Remember: Do not eat food or drink liquids :After Midnight.   BRUSH YOUR TEETH MORNING OF SURGERY AND RINSE YOUR MOUTH OUT, NO CHEWING GUM CANDY OR MINTS.    No food after midnight.   You may have clear liquid until 4:30 AM.    At 4:00 AM drink pre surgery drink.   Nothing by mouth after 4:30 AM.   Take these medicines the morning of surgery with A SIP OF WATER: Omeprazole                                 You may not have any metal on your body including              piercings  Do not wear jewelry, make-up, lotions, powders or perfumes, deodorant                         Men may shave face and neck.   Do not bring valuables to the hospital. [place].  Contacts, dentures or bridgework may not be worn into surgery.      Patients discharged the day of surgery will not be allowed to drive home  . IF YOU ARE HAVING SURGERY AND GOING HOME THE SAME DAY, YOU MUST HAVE AN ADULT TO DRIVE YOU HOME AND BE WITH YOU FOR 24 HOURS.  YOU MAY GO HOME BY TAXI OR UBER OR ORTHERWISE, BUT AN ADULT MUST  ACCOMPANY YOU HOME AND STAY WITH YOU FOR 24 HOURS.  Name and phone number of your driver:  Special Instructions: N/A              Please read over the following fact sheets you were given: _____________________________________________________________________             [facility] - Preparing for Surgery Before surgery, you can play an important role.  Because skin is not sterile, your skin needs to be as free of germs as possible.  You can reduce the number of germs on your skin by washing with CHG (chlorahexidine gluconate) soap before surgery.  CHG is an antiseptic cleaner which kills germs and bonds with the skin to continue killing germs even after washing. Please DO NOT use if you have an allergy to CHG or antibacterial soaps.  If your skin becomes reddened/irritated stop using the CHG and inform your nurse when you arrive at Short Stay. .  You may shave your face/neck.  Please follow these instructions carefully:  1.  Shower with CHG Soap the night before surgery and the  morning of Surgery.  2.  If you choose to wash your hair, wash your hair first as usual with your  normal  shampoo.  3.  After you shampoo, rinse your hair and body thoroughly to remove the  shampoo.                                        4.  Use CHG as you would any other liquid soap.  You can apply chg directly  to the skin and wash                       Gently with a scrungie or clean washcloth.  5.  Apply the CHG Soap to your body ONLY FROM THE NECK DOWN.   Do not use on face/ open                           Wound or open sores. Avoid contact with eyes, ears mouth and genitals (private parts).                       Wash face,  Genitals (private parts) with your normal soap.             6.  Wash thoroughly, paying special attention to the area where your surgery  will be performed.  7.  Thoroughly rinse your body with warm water from the neck down.  8.  DO NOT shower/wash with your normal soap after using and  rinsing off  the CHG Soap.             9.  Pat yourself dry with a clean towel.            10.  Wear clean pajamas.            11.  Place clean sheets on your bed the night of your first shower and do not  sleep with pets. Day of Surgery : Do not apply any lotions/deodorants the morning of surgery.  Please wear clean clothes to the hospital/surgery center.  FAILURE TO FOLLOW THESE INSTRUCTIONS MAY RESULT IN THE CANCELLATION OF YOUR SURGERY PATIENT SIGNATURE_________________________________  NURSE SIGNATURE__________________________________  ________________________________________________________________________   Adam Phenix  An incentive spirometer is a tool that can help keep your lungs clear and active. This tool measures how well you are filling your lungs with each breath. Taking long deep breaths may help reverse or decrease the chance of developing breathing (pulmonary) problems (especially infection) following:  A long period of time when you are unable to move or be active. BEFORE THE PROCEDURE   If the spirometer includes an indicator to show your best effort, your nurse or respiratory therapist will set it to a desired goal.  If possible, sit up straight or lean slightly forward. Try not to slouch.  Hold the incentive spirometer in an upright position. INSTRUCTIONS FOR USE  1. Sit on the edge of your bed if possible, or sit up as far as you can in bed or on a chair. 2. Hold the incentive spirometer  in an upright position. 3. Breathe out normally. 4. Place the mouthpiece in your mouth and seal your lips tightly around it. 5. Breathe in slowly and as deeply as possible, raising the piston or the ball toward the top of the column. 6. Hold your breath for 3-5 seconds or for as long as possible. Allow the piston or ball to fall to the bottom of the column. 7. Remove the mouthpiece from your mouth and breathe out normally. 8. Rest for a few seconds and repeat Steps 1  through 7 at least 10 times every 1-2 hours when you are awake. Take your time and take a few normal breaths between deep breaths. 9. The spirometer may include an indicator to show your best effort. Use the indicator as a goal to work toward during each repetition. 10. After each set of 10 deep breaths, practice coughing to be sure your lungs are clear. If you have an incision (the cut made at the time of surgery), support your incision when coughing by placing a pillow or rolled up towels firmly against it. Once you are able to get out of bed, walk around indoors and cough well. You may stop using the incentive spirometer when instructed by your caregiver.  RISKS AND COMPLICATIONS  Take your time so you do not get dizzy or light-headed.  If you are in pain, you may need to take or ask for pain medication before doing incentive spirometry. It is harder to take a deep breath if you are having pain. AFTER USE  Rest and breathe slowly and easily.  It can be helpful to keep track of a log of your progress. Your caregiver can provide you with a simple table to help with this. If you are using the spirometer at home, follow these instructions: [place] IF:   You are having difficultly using the spirometer.  You have trouble using the spirometer as often as instructed.  Your pain medication is not giving enough relief while using the spirometer.  You develop fever of 100.5 F (38.1 C) or higher. SEEK IMMEDIATE MEDICAL CARE IF:   You cough up bloody sputum that had not been present before.  You develop fever of 102 F (38.9 C) or greater.  You develop worsening pain at or near the incision site. MAKE SURE YOU:   Understand these instructions.  Will watch your condition.  Will get help right away if you are not doing well or get worse. Document Released: 06/21/2006 Document Revised: 05/03/2011 Document Reviewed: 08/22/2006 [facility] Patient Information 2014 [place],  [place].   ________________________________________________________________________

## 2020-04-04 ENCOUNTER — Encounter (HOSPITAL_COMMUNITY): Payer: Self-pay

## 2020-04-04 ENCOUNTER — Encounter (HOSPITAL_COMMUNITY)
Admission: RE | Admit: 2020-04-04 | Discharge: 2020-04-04 | Disposition: A | Discharge status: Home / Self Care | Payer: Medicare Other | Source: Ambulatory Visit | Attending: Orthopedic Surgery | Admitting: Orthopedic Surgery

## 2020-04-04 ENCOUNTER — Other Ambulatory Visit: Payer: Self-pay | Admitting: Orthopedic Surgery

## 2020-04-04 ENCOUNTER — Other Ambulatory Visit: Payer: Self-pay

## 2020-04-04 DIAGNOSIS — K219 Gastro-esophageal reflux disease without esophagitis: Secondary | ICD-10-CM | POA: Diagnosis not present

## 2020-04-04 DIAGNOSIS — Z87891 Personal history of nicotine dependence: Secondary | ICD-10-CM | POA: Diagnosis not present

## 2020-04-04 DIAGNOSIS — Z01812 Encounter for preprocedural laboratory examination: Secondary | ICD-10-CM | POA: Insufficient documentation

## 2020-04-04 DIAGNOSIS — Z79899 Other long term (current) drug therapy: Secondary | ICD-10-CM | POA: Insufficient documentation

## 2020-04-04 DIAGNOSIS — M1712 Unilateral primary osteoarthritis, left knee: Secondary | ICD-10-CM | POA: Diagnosis not present

## 2020-04-04 DIAGNOSIS — I739 Peripheral vascular disease, unspecified: Secondary | ICD-10-CM | POA: Insufficient documentation

## 2020-04-04 HISTORY — DX: Unspecified osteoarthritis, unspecified site: M19.90

## 2020-04-04 HISTORY — DX: Angina pectoris, unspecified: I20.9

## 2020-04-04 LAB — COMPREHENSIVE METABOLIC PANEL
ALT: 14 U/L (ref 0–44)
AST: 14 U/L — ABNORMAL LOW (ref 15–41)
Albumin: 4.2 g/dL (ref 3.5–5.0)
Alkaline Phosphatase: 106 U/L (ref 38–126)
Anion gap: 11 (ref 5–15)
BUN: 15 mg/dL (ref 8–23)
CO2: 23 mmol/L (ref 22–32)
Calcium: 9.6 mg/dL (ref 8.9–10.3)
Chloride: 103 mmol/L (ref 98–111)
Creatinine, Ser: 1.1 mg/dL (ref 0.61–1.24)
GFR, Estimated: >60 mL/min (ref >60)
Glucose, Bld: 101 mg/dL — ABNORMAL HIGH (ref 70–99)
Potassium: 4 mmol/L (ref 3.5–5.1)
Sodium: 137 mmol/L (ref 135–145)
Total Bilirubin: 0.9 mg/dL (ref 0.3–1.2)
Total Protein: 7.7 g/dL (ref 6.5–8.1)

## 2020-04-04 LAB — CBC WITH DIFFERENTIAL/PLATELET
Abs Immature Granulocytes: 0.03 10*3/uL (ref 0.00–0.07)
Basophils Absolute: 0.1 10*3/uL (ref 0.0–0.1)
Basophils Relative: 1 %
Eosinophils Absolute: 0.1 10*3/uL (ref 0.0–0.5)
Eosinophils Relative: 1 %
HCT: 52.5 % — ABNORMAL HIGH (ref 39.0–52.0)
Hemoglobin: 17.3 g/dL — ABNORMAL HIGH (ref 13.0–17.0)
Immature Granulocytes: 0 %
Lymphocytes Relative: 26 %
Lymphs Abs: 2.1 10*3/uL (ref 0.7–4.0)
MCH: 28.8 pg (ref 26.0–34.0)
MCHC: 33 g/dL (ref 30.0–36.0)
MCV: 87.5 fL (ref 80.0–100.0)
Monocytes Absolute: 0.6 10*3/uL (ref 0.1–1.0)
Monocytes Relative: 8 %
Neutro Abs: 5.3 10*3/uL (ref 1.7–7.7)
Neutrophils Relative %: 64 %
Platelets: 284 10*3/uL (ref 150–400)
RBC: 6 MIL/uL — ABNORMAL HIGH (ref 4.22–5.81)
RDW: 12.7 % (ref 11.5–15.5)
WBC: 8.2 10*3/uL (ref 4.0–10.5)
nRBC: 0 % (ref 0.0–0.2)

## 2020-04-04 LAB — SURGICAL PCR SCREEN
MRSA, PCR: NEGATIVE
Staphylococcus aureus: NEGATIVE

## 2020-04-04 NOTE — Progress Notes (Signed)
COVID Vaccine Completed:Yes Date COVID Vaccine completed:01/2020 COVID vaccine manufacturer:  [employer]   PCP - Delman Cheadle PA Cardiologist - Dr. Ellison Hughs  Chest x-ray - no EKG - no Stress Test - no ECHO - no Cardiac Cath - no Pacemaker/ICD device last checked:NA  Sleep Study - no CPAP -   Fasting Blood Sugar - NA Checks Blood Sugar _____ times a day  Blood Thinner Instructions:NA Aspirin Instructions: Last Dose:  Anesthesia review:   Patient denies shortness of breath, fever, cough and chest pain at PAT appointment yes  Patient verbalized understanding of instructions that were given to them at the PAT appointment. Patient was also instructed that they will need to review over the PAT instructions again at home before surgery.yes Pt reports no SOB climbing stairs, doing housework or with ADLs. He has an aortic aneurysm being watched. Ct was done 03/19/20-epic

## 2020-04-07 NOTE — Anesthesia Preprocedure Evaluation (Addendum)
Anesthesia Evaluation  Patient identified by MRN, date of birth, ID band Patient awake    Reviewed: Allergy & Precautions, NPO status , Patient's Chart, lab work & pertinent test results  Airway Mallampati: I  TM Distance: >3 FB Neck ROM: Full    Dental  (+) Edentulous Upper, Poor Dentition, Dental Advisory Given   Pulmonary Patient abstained from smoking., former smoker,    breath sounds clear to auscultation       Cardiovascular + Peripheral Vascular Disease   Rhythm:Regular Rate:Normal     Neuro/Psych negative neurological ROS  negative psych ROS   GI/Hepatic Neg liver ROS, GERD  Medicated,  Endo/Other  negative endocrine ROS  Renal/GU negative Renal ROS     Musculoskeletal  (+) Arthritis ,   Abdominal Normal abdominal exam  (+)   Peds  Hematology negative hematology ROS (+)   Anesthesia Other Findings   Reproductive/Obstetrics                           Anesthesia Physical Anesthesia Plan  ASA: III  Anesthesia Plan: Spinal   Post-op Pain Management:  Regional for Post-op pain   Induction: Intravenous  PONV Risk Score and Plan: 2 and Ondansetron and Propofol infusion  Airway Management Planned: Natural Airway and Simple Face Mask  Additional Equipment: None  Intra-op Plan:   Post-operative Plan:   Informed Consent: I have reviewed the patients History and Physical, chart, labs and discussed the procedure including the risks, benefits and alternatives for the proposed anesthesia with the patient or authorized representative who has indicated his/her understanding and acceptance.       Plan Discussed with: CRNA  Anesthesia Plan Comments: (See PAT note 04/04/2020, Konrad Felix, PA-C  Lab Results      Component                Value               Date                      WBC                      8.2                 04/04/2020                HGB                       17.3 (H)            04/04/2020                HCT                      52.5 (H)            04/04/2020                MCV                      87.5                04/04/2020                PLT                      284  04/04/2020           )      Anesthesia Quick Evaluation
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
## 2020-04-10 ENCOUNTER — Other Ambulatory Visit (HOSPITAL_COMMUNITY)
Admission: RE | Admit: 2020-04-10 | Discharge: 2020-04-10 | Disposition: A | Discharge status: Home / Self Care | Payer: Medicare Other | Source: Ambulatory Visit | Attending: Orthopedic Surgery | Admitting: Orthopedic Surgery

## 2020-04-10 DIAGNOSIS — Z20822 Contact with and (suspected) exposure to covid-19: Secondary | ICD-10-CM | POA: Diagnosis not present

## 2020-04-10 DIAGNOSIS — Z01812 Encounter for preprocedural laboratory examination: Secondary | ICD-10-CM | POA: Insufficient documentation

## 2020-04-10 LAB — SARS CORONAVIRUS 2 (TAT 6-24 HRS): SARS Coronavirus 2: NEGATIVE

## 2020-04-12 ENCOUNTER — Encounter (HOSPITAL_COMMUNITY): Payer: Self-pay | Admitting: Orthopedic Surgery

## 2020-04-13 MED ORDER — BUPIVACAINE LIPOSOME 1.3 % IJ SUSP
20.0000 mL | Freq: Once | INTRAMUSCULAR | Status: DC
  Filled 2020-04-13: qty 20

## 2020-04-14 ENCOUNTER — Other Ambulatory Visit: Payer: Self-pay

## 2020-04-14 ENCOUNTER — Encounter (HOSPITAL_COMMUNITY): Payer: Self-pay | Admitting: Orthopedic Surgery

## 2020-04-14 ENCOUNTER — Ambulatory Visit (HOSPITAL_COMMUNITY): Payer: Medicare Other | Admitting: Physician Assistant

## 2020-04-14 ENCOUNTER — Observation Stay (HOSPITAL_COMMUNITY)
Admission: RE | Admit: 2020-04-14 | Discharge: 2020-04-15 | Disposition: A | Discharge status: Home / Self Care | Payer: Medicare Other | Attending: Orthopedic Surgery | Admitting: Orthopedic Surgery

## 2020-04-14 ENCOUNTER — Ambulatory Visit (HOSPITAL_COMMUNITY): Payer: Medicare Other | Admitting: Anesthesiology

## 2020-04-14 ENCOUNTER — Encounter (HOSPITAL_COMMUNITY)
Admission: RE | Disposition: A | Discharge status: Home / Self Care | Payer: Self-pay | Source: Home / Self Care | Attending: Orthopedic Surgery

## 2020-04-14 DIAGNOSIS — I739 Peripheral vascular disease, unspecified: Secondary | ICD-10-CM | POA: Diagnosis not present

## 2020-04-14 DIAGNOSIS — Z87891 Personal history of nicotine dependence: Secondary | ICD-10-CM | POA: Diagnosis not present

## 2020-04-14 DIAGNOSIS — I712 Thoracic aortic aneurysm, without rupture: Secondary | ICD-10-CM | POA: Insufficient documentation

## 2020-04-14 DIAGNOSIS — M25562 Pain in left knee: Secondary | ICD-10-CM | POA: Diagnosis not present

## 2020-04-14 DIAGNOSIS — G8929 Other chronic pain: Secondary | ICD-10-CM | POA: Insufficient documentation

## 2020-04-14 DIAGNOSIS — Z96659 Presence of unspecified artificial knee joint: Secondary | ICD-10-CM

## 2020-04-14 DIAGNOSIS — Z79899 Other long term (current) drug therapy: Secondary | ICD-10-CM | POA: Insufficient documentation

## 2020-04-14 DIAGNOSIS — Z85820 Personal history of malignant melanoma of skin: Secondary | ICD-10-CM | POA: Diagnosis not present

## 2020-04-14 DIAGNOSIS — M1712 Unilateral primary osteoarthritis, left knee: Secondary | ICD-10-CM | POA: Diagnosis present

## 2020-04-14 HISTORY — PX: TOTAL KNEE ARTHROPLASTY: SHX125

## 2020-04-14 SURGERY — ARTHROPLASTY, KNEE, TOTAL
Anesthesia: Spinal | Site: Knee | Laterality: Left

## 2020-04-14 MED ORDER — SODIUM CHLORIDE (PF) 0.9 % IJ SOLN
INTRAMUSCULAR | Status: AC
  Filled 2020-04-14: qty 20

## 2020-04-14 MED ORDER — MENTHOL 3 MG MT LOZG: 1.0000 | LOZENGE | OROMUCOSAL | Status: DC | PRN

## 2020-04-14 MED ORDER — MIDAZOLAM HCL 5 MG/5ML IJ SOLN
INTRAMUSCULAR | Status: DC | PRN
  Administered 2020-04-14: 2 mg via INTRAVENOUS

## 2020-04-14 MED ORDER — ONDANSETRON HCL 4 MG/2ML IJ SOLN: 4.0000 mg | Freq: Four times a day (QID) | INTRAMUSCULAR | Status: DC | PRN

## 2020-04-14 MED ORDER — ALUM & MAG HYDROXIDE-SIMETH 200-200-20 MG/5ML PO SUSP: 30.0000 mL | ORAL | Status: DC | PRN

## 2020-04-14 MED ORDER — WATER FOR IRRIGATION, STERILE IR SOLN
Status: DC | PRN
  Administered 2020-04-14: 2000 mL

## 2020-04-14 MED ORDER — BUPIVACAINE-MELOXICAM ER 400-12 MG/14ML IJ SOLN
INTRAMUSCULAR | Status: AC
  Filled 2020-04-14: qty 1

## 2020-04-14 MED ORDER — FENTANYL CITRATE (PF) 100 MCG/2ML IJ SOLN
INTRAMUSCULAR | Status: AC
  Administered 2020-04-14: 50 ug via INTRAVENOUS
  Filled 2020-04-14: qty 2

## 2020-04-14 MED ORDER — PROPOFOL 500 MG/50ML IV EMUL
INTRAVENOUS | Status: DC | PRN
  Administered 2020-04-14 (×2): 50 mg via INTRAVENOUS

## 2020-04-14 MED ORDER — ONDANSETRON HCL 4 MG PO TABS
4.0000 mg | ORAL_TABLET | Freq: Four times a day (QID) | ORAL | Status: DC | PRN
  Filled 2020-04-14: qty 1

## 2020-04-14 MED ORDER — MEPERIDINE HCL 50 MG/ML IJ SOLN: 6.2500 mg | INTRAMUSCULAR | Status: DC | PRN

## 2020-04-14 MED ORDER — LIDOCAINE HCL (PF) 2 % IJ SOLN
INTRAMUSCULAR | Status: AC
  Filled 2020-04-14: qty 5

## 2020-04-14 MED ORDER — PROPOFOL 500 MG/50ML IV EMUL
INTRAVENOUS | Status: AC
  Filled 2020-04-14: qty 50

## 2020-04-14 MED ORDER — GABAPENTIN 300 MG PO CAPS
300.0000 mg | ORAL_CAPSULE | Freq: Three times a day (TID) | ORAL | Status: DC
  Administered 2020-04-14 – 2020-04-15 (×3): 300 mg via ORAL
  Filled 2020-04-14 (×3): qty 1

## 2020-04-14 MED ORDER — DIPHENHYDRAMINE HCL 12.5 MG/5ML PO ELIX
12.5000 mg | ORAL_SOLUTION | ORAL | Status: DC | PRN
  Administered 2020-04-14: 25 mg via ORAL
  Filled 2020-04-14: qty 10

## 2020-04-14 MED ORDER — CEFAZOLIN SODIUM-DEXTROSE 2-4 GM/100ML-% IV SOLN
2.0000 g | INTRAVENOUS | Status: AC
  Administered 2020-04-14: 2 g via INTRAVENOUS
  Filled 2020-04-14: qty 100

## 2020-04-14 MED ORDER — METOCLOPRAMIDE HCL 5 MG/ML IJ SOLN: 5.0000 mg | Freq: Three times a day (TID) | INTRAMUSCULAR | Status: DC | PRN

## 2020-04-14 MED ORDER — ONDANSETRON HCL 4 MG/2ML IJ SOLN
INTRAMUSCULAR | Status: DC | PRN
  Administered 2020-04-14: 4 mg via INTRAVENOUS

## 2020-04-14 MED ORDER — BUPIVACAINE HCL (PF) 0.75 % IJ SOLN
INTRAMUSCULAR | Status: DC | PRN
  Administered 2020-04-14: 1.6 mL via INTRATHECAL

## 2020-04-14 MED ORDER — ORAL CARE MOUTH RINSE: 15.0000 mL | Freq: Once | OROMUCOSAL | Status: AC

## 2020-04-14 MED ORDER — DEXAMETHASONE SODIUM PHOSPHATE 10 MG/ML IJ SOLN
10.0000 mg | Freq: Once | INTRAMUSCULAR | Status: AC
  Administered 2020-04-15: 10 mg via INTRAVENOUS
  Filled 2020-04-14: qty 1

## 2020-04-14 MED ORDER — DEXAMETHASONE SODIUM PHOSPHATE 10 MG/ML IJ SOLN
8.0000 mg | Freq: Once | INTRAMUSCULAR | Status: AC
  Administered 2020-04-14: 10 mg via INTRAVENOUS

## 2020-04-14 MED ORDER — MIDAZOLAM HCL 2 MG/2ML IJ SOLN: 1.0000 mg | Freq: Once | INTRAMUSCULAR | Status: AC

## 2020-04-14 MED ORDER — EPHEDRINE SULFATE-NACL 50-0.9 MG/10ML-% IV SOSY
PREFILLED_SYRINGE | INTRAVENOUS | Status: DC | PRN
  Administered 2020-04-14 (×5): 10 mg via INTRAVENOUS

## 2020-04-14 MED ORDER — METOCLOPRAMIDE HCL 5 MG PO TABS
5.0000 mg | ORAL_TABLET | Freq: Three times a day (TID) | ORAL | Status: DC | PRN
  Filled 2020-04-14: qty 2

## 2020-04-14 MED ORDER — SODIUM CHLORIDE 0.9 % IV SOLN: INTRAVENOUS | Status: DC

## 2020-04-14 MED ORDER — PANTOPRAZOLE SODIUM 40 MG PO TBEC
40.0000 mg | DELAYED_RELEASE_TABLET | Freq: Every day | ORAL | Status: DC
  Administered 2020-04-14 – 2020-04-15 (×2): 40 mg via ORAL
  Filled 2020-04-14 (×2): qty 1

## 2020-04-14 MED ORDER — ASPIRIN EC 325 MG PO TBEC
325.0000 mg | DELAYED_RELEASE_TABLET | Freq: Two times a day (BID) | ORAL | Status: DC
  Administered 2020-04-15: 325 mg via ORAL
  Filled 2020-04-14: qty 1

## 2020-04-14 MED ORDER — ONDANSETRON HCL 4 MG/2ML IJ SOLN
INTRAMUSCULAR | Status: AC
  Filled 2020-04-14: qty 2

## 2020-04-14 MED ORDER — FLEET ENEMA 7-19 GM/118ML RE ENEM: 1.0000 | ENEMA | Freq: Once | RECTAL | Status: DC | PRN

## 2020-04-14 MED ORDER — CHLORHEXIDINE GLUCONATE 0.12 % MT SOLN
15.0000 mL | Freq: Once | OROMUCOSAL | Status: AC
  Administered 2020-04-14: 15 mL via OROMUCOSAL

## 2020-04-14 MED ORDER — METHOCARBAMOL 500 MG IVPB - SIMPLE MED
500.0000 mg | Freq: Four times a day (QID) | INTRAVENOUS | Status: DC | PRN
  Filled 2020-04-14: qty 50

## 2020-04-14 MED ORDER — OXYCODONE HCL 5 MG PO TABS
ORAL_TABLET | ORAL | Status: AC
  Filled 2020-04-14: qty 1

## 2020-04-14 MED ORDER — TRANEXAMIC ACID-NACL 1000-0.7 MG/100ML-% IV SOLN
1000.0000 mg | Freq: Once | INTRAVENOUS | Status: AC
  Administered 2020-04-14: 1000 mg via INTRAVENOUS
  Filled 2020-04-14: qty 100

## 2020-04-14 MED ORDER — OXYCODONE HCL 5 MG PO TABS
5.0000 mg | ORAL_TABLET | ORAL | Status: DC | PRN
  Administered 2020-04-14 (×3): 5 mg via ORAL
  Administered 2020-04-15 (×2): 10 mg via ORAL
  Filled 2020-04-14: qty 2
  Filled 2020-04-14: qty 1
  Filled 2020-04-14: qty 2
  Filled 2020-04-14: qty 1

## 2020-04-14 MED ORDER — DEXAMETHASONE SODIUM PHOSPHATE 10 MG/ML IJ SOLN
INTRAMUSCULAR | Status: AC
  Filled 2020-04-14: qty 1

## 2020-04-14 MED ORDER — LACTATED RINGERS IV SOLN: INTRAVENOUS | Status: DC

## 2020-04-14 MED ORDER — AMISULPRIDE (ANTIEMETIC) 5 MG/2ML IV SOLN: 10.0000 mg | Freq: Once | INTRAVENOUS | Status: DC | PRN

## 2020-04-14 MED ORDER — FENTANYL CITRATE (PF) 100 MCG/2ML IJ SOLN: 50.0000 ug | Freq: Once | INTRAMUSCULAR | Status: AC

## 2020-04-14 MED ORDER — PHENYLEPHRINE 40 MCG/ML (10ML) SYRINGE FOR IV PUSH (FOR BLOOD PRESSURE SUPPORT)
PREFILLED_SYRINGE | INTRAVENOUS | Status: AC
  Filled 2020-04-14: qty 10

## 2020-04-14 MED ORDER — FERROUS SULFATE 325 (65 FE) MG PO TABS
325.0000 mg | ORAL_TABLET | Freq: Three times a day (TID) | ORAL | Status: DC
  Administered 2020-04-14 – 2020-04-15 (×2): 325 mg via ORAL
  Filled 2020-04-14 (×2): qty 1

## 2020-04-14 MED ORDER — ACETAMINOPHEN 500 MG PO TABS
1000.0000 mg | ORAL_TABLET | Freq: Four times a day (QID) | ORAL | Status: AC
  Administered 2020-04-14 – 2020-04-15 (×4): 1000 mg via ORAL
  Filled 2020-04-14 (×4): qty 2

## 2020-04-14 MED ORDER — BUPIVACAINE-MELOXICAM ER 400-12 MG/14ML IJ SOLN
INTRAMUSCULAR | Status: DC | PRN
  Administered 2020-04-14 (×2): 7 mL

## 2020-04-14 MED ORDER — ACETAMINOPHEN 10 MG/ML IV SOLN: 1000.0000 mg | Freq: Once | INTRAVENOUS | Status: DC | PRN

## 2020-04-14 MED ORDER — MIDAZOLAM HCL 2 MG/2ML IJ SOLN
INTRAMUSCULAR | Status: AC
  Filled 2020-04-14: qty 2

## 2020-04-14 MED ORDER — PROPOFOL 10 MG/ML IV BOLUS
INTRAVENOUS | Status: AC
  Filled 2020-04-14: qty 20

## 2020-04-14 MED ORDER — TRANEXAMIC ACID-NACL 1000-0.7 MG/100ML-% IV SOLN
1000.0000 mg | INTRAVENOUS | Status: AC
  Administered 2020-04-14: 1000 mg via INTRAVENOUS
  Filled 2020-04-14: qty 100

## 2020-04-14 MED ORDER — FENTANYL CITRATE (PF) 100 MCG/2ML IJ SOLN
INTRAMUSCULAR | Status: DC | PRN
  Administered 2020-04-14: 50 ug via INTRAVENOUS

## 2020-04-14 MED ORDER — HYDROMORPHONE HCL 1 MG/ML IJ SOLN
0.2500 mg | INTRAMUSCULAR | Status: DC | PRN
Start: 2020-04-14 — End: 2020-04-14

## 2020-04-14 MED ORDER — MIDAZOLAM HCL 2 MG/2ML IJ SOLN
INTRAMUSCULAR | Status: AC
  Administered 2020-04-14: 2 mg via INTRAVENOUS
  Filled 2020-04-14: qty 2

## 2020-04-14 MED ORDER — BUPIVACAINE HCL (PF) 0.75 % IJ SOLN: INTRAMUSCULAR | Status: DC | PRN

## 2020-04-14 MED ORDER — ALBUMIN HUMAN 5 % IV SOLN: INTRAVENOUS | Status: DC | PRN

## 2020-04-14 MED ORDER — HYDROMORPHONE HCL 1 MG/ML IJ SOLN
0.5000 mg | INTRAMUSCULAR | Status: DC | PRN
  Administered 2020-04-15: 1 mg via INTRAVENOUS
  Filled 2020-04-14: qty 1

## 2020-04-14 MED ORDER — PHENYLEPHRINE 40 MCG/ML (10ML) SYRINGE FOR IV PUSH (FOR BLOOD PRESSURE SUPPORT)
PREFILLED_SYRINGE | INTRAVENOUS | Status: DC | PRN
  Administered 2020-04-14: 80 ug via INTRAVENOUS
  Administered 2020-04-14 (×2): 40 ug via INTRAVENOUS
  Administered 2020-04-14 (×3): 80 ug via INTRAVENOUS

## 2020-04-14 MED ORDER — PHENOL 1.4 % MT LIQD: 1.0000 | OROMUCOSAL | Status: DC | PRN

## 2020-04-14 MED ORDER — ACETAMINOPHEN 325 MG PO TABS: 325.0000 mg | ORAL_TABLET | Freq: Once | ORAL | Status: DC | PRN

## 2020-04-14 MED ORDER — LIDOCAINE HCL (CARDIAC) PF 100 MG/5ML IV SOSY
PREFILLED_SYRINGE | INTRAVENOUS | Status: DC | PRN
  Administered 2020-04-14: 50 mg via INTRATRACHEAL

## 2020-04-14 MED ORDER — PROPOFOL 500 MG/50ML IV EMUL
INTRAVENOUS | Status: DC | PRN
  Administered 2020-04-14: 25 ug/kg/min via INTRAVENOUS

## 2020-04-14 MED ORDER — DOCUSATE SODIUM 100 MG PO CAPS
100.0000 mg | ORAL_CAPSULE | Freq: Two times a day (BID) | ORAL | Status: DC
  Administered 2020-04-14 – 2020-04-15 (×2): 100 mg via ORAL
  Filled 2020-04-14 (×2): qty 1

## 2020-04-14 MED ORDER — BISACODYL 5 MG PO TBEC: 5.0000 mg | DELAYED_RELEASE_TABLET | Freq: Every day | ORAL | Status: DC | PRN

## 2020-04-14 MED ORDER — ACETAMINOPHEN 160 MG/5ML PO SOLN: 325.0000 mg | Freq: Once | ORAL | Status: DC | PRN

## 2020-04-14 MED ORDER — ZOLPIDEM TARTRATE 5 MG PO TABS: 5.0000 mg | ORAL_TABLET | Freq: Every evening | ORAL | Status: DC | PRN

## 2020-04-14 MED ORDER — SENNOSIDES-DOCUSATE SODIUM 8.6-50 MG PO TABS
1.0000 | ORAL_TABLET | Freq: Every evening | ORAL | Status: DC | PRN
Start: 2020-04-14 — End: 2020-04-15

## 2020-04-14 MED ORDER — BUPIVACAINE-EPINEPHRINE (PF) 0.25% -1:200000 IJ SOLN
INTRAMUSCULAR | Status: DC | PRN
Start: 2020-04-14 — End: 2020-04-14
  Administered 2020-04-14: 20 mL via PERINEURAL

## 2020-04-14 MED ORDER — FENTANYL CITRATE (PF) 100 MCG/2ML IJ SOLN
INTRAMUSCULAR | Status: AC
  Filled 2020-04-14: qty 2

## 2020-04-14 MED ORDER — SODIUM CHLORIDE 0.9 % IR SOLN
Status: DC | PRN
  Administered 2020-04-14 (×2): 1000 mL

## 2020-04-14 MED ORDER — METHOCARBAMOL 500 MG PO TABS
500.0000 mg | ORAL_TABLET | Freq: Four times a day (QID) | ORAL | Status: DC | PRN
  Administered 2020-04-14 (×2): 500 mg via ORAL
  Filled 2020-04-14 (×2): qty 1

## 2020-04-14 MED ORDER — POVIDONE-IODINE 10 % EX SWAB
2.0000 "application " | Freq: Once | CUTANEOUS | Status: AC
  Administered 2020-04-14: 2 via TOPICAL

## 2020-04-14 MED ORDER — BUPIVACAINE-EPINEPHRINE (PF) 0.25% -1:200000 IJ SOLN: INTRAMUSCULAR | Status: DC | PRN

## 2020-04-14 MED ORDER — GABAPENTIN 300 MG PO CAPS
300.0000 mg | ORAL_CAPSULE | Freq: Once | ORAL | Status: AC
  Administered 2020-04-14: 300 mg via ORAL
  Filled 2020-04-14: qty 1

## 2020-04-14 MED ORDER — BUPIVACAINE-EPINEPHRINE (PF) 0.25% -1:200000 IJ SOLN
INTRAMUSCULAR | Status: AC
  Filled 2020-04-14: qty 30

## 2020-04-14 MED ORDER — EPHEDRINE 5 MG/ML INJ
INTRAVENOUS | Status: AC
  Filled 2020-04-14: qty 10

## 2020-04-14 MED ORDER — CEFAZOLIN SODIUM-DEXTROSE 2-4 GM/100ML-% IV SOLN
2.0000 g | Freq: Four times a day (QID) | INTRAVENOUS | Status: AC
  Administered 2020-04-14 (×2): 2 g via INTRAVENOUS
  Filled 2020-04-14 (×2): qty 100

## 2020-04-14 MED ORDER — ACETAMINOPHEN 500 MG PO TABS
1000.0000 mg | ORAL_TABLET | Freq: Once | ORAL | Status: AC
  Administered 2020-04-14: 1000 mg via ORAL
  Filled 2020-04-14: qty 2

## 2020-04-14 SURGICAL SUPPLY — 54 items
BAG ZIPLOCK 12X15 (MISCELLANEOUS) ×2 IMPLANT
BLADE SAGITTAL 13X1.27X60 (BLADE) ×2 IMPLANT
BLADE SAW SGTL 83.5X18.5 (BLADE) ×2 IMPLANT
BLADE SURG 15 STRL LF DISP TIS (BLADE) ×1 IMPLANT
BLADE SURG 15 STRL SS (BLADE) ×2
BLADE SURG SZ10 CARB STEEL (BLADE) ×4 IMPLANT
BNDG ELASTIC 6X5.8 VLCR STR LF (GAUZE/BANDAGES/DRESSINGS) ×2 IMPLANT
BOWL SMART MIX CTS (DISPOSABLE) ×2 IMPLANT
CEMENT BONE SIMPLEX SPEEDSET (Cement) ×4 IMPLANT
COVER SURGICAL LIGHT HANDLE (MISCELLANEOUS) ×2 IMPLANT
COVER WAND RF STERILE (DRAPES) IMPLANT
CUFF TOURN SGL QUICK 34 (TOURNIQUET CUFF) ×2
CUFF TRNQT CYL 34X4.125X (TOURNIQUET CUFF) ×1 IMPLANT
DECANTER SPIKE VIAL GLASS SM (MISCELLANEOUS) IMPLANT
DRAPE INCISE IOBAN 66X45 STRL (DRAPES) ×4 IMPLANT
DRAPE U-SHAPE 47X51 STRL (DRAPES) ×2 IMPLANT
DRSG AQUACEL AG ADV 3.5X10 (GAUZE/BANDAGES/DRESSINGS) ×2 IMPLANT
DURAPREP 26ML APPLICATOR (WOUND CARE) ×4 IMPLANT
ELECT REM PT RETURN 15FT ADLT (MISCELLANEOUS) ×2 IMPLANT
FEMUR  CMT CCR STD SZ11 L KNEE (Knees) ×2 IMPLANT
FEMUR CMT CCR STD SZ11 L KNEE (Knees) ×1 IMPLANT
FEMUR CMTD CCR STD SZ11 L KNEE (Knees) ×1 IMPLANT
GLOVE SURG ENC TEXT LTX SZ7.5 (GLOVE) ×2 IMPLANT
GLOVE SURG ORTHO LTX SZ8 (GLOVE) ×6 IMPLANT
GLOVE SURG UNDER POLY LF SZ7.5 (GLOVE) ×2 IMPLANT
GLOVE SURG UNDER POLY LF SZ8.5 (GLOVE) ×4 IMPLANT
GOWN STRL REUS W/ TWL XL LVL3 (GOWN DISPOSABLE) ×2 IMPLANT
GOWN STRL REUS W/TWL XL LVL3 (GOWN DISPOSABLE) ×4
HANDPIECE INTERPULSE COAX TIP (DISPOSABLE) ×2
HOLDER FOLEY CATH W/STRAP (MISCELLANEOUS) ×2 IMPLANT
HOOD PEEL AWAY FLYTE STAYCOOL (MISCELLANEOUS) ×6 IMPLANT
INSERT TIBIAL PLY L CD10-12X11 (Knees) ×2 IMPLANT
KIT TURNOVER KIT A (KITS) ×2 IMPLANT
MANIFOLD NEPTUNE II (INSTRUMENTS) ×2 IMPLANT
NEEDLE HYPO 22GX1.5 SAFETY (NEEDLE) ×2 IMPLANT
NS IRRIG 1000ML POUR BTL (IV SOLUTION) ×2 IMPLANT
PACK TOTAL KNEE CUSTOM (KITS) ×2 IMPLANT
PENCIL SMOKE EVACUATOR (MISCELLANEOUS) ×2 IMPLANT
PROTECTOR NERVE ULNAR (MISCELLANEOUS) ×2 IMPLANT
SET HNDPC FAN SPRY TIP SCT (DISPOSABLE) ×1 IMPLANT
STEM POLY PAT PLY 35M KNEE (Knees) ×2 IMPLANT
STEM TIBIA 5 DEG SZ G L KNEE (Knees) ×1 IMPLANT
STRIP CLOSURE SKIN 1/2X4 (GAUZE/BANDAGES/DRESSINGS) ×6 IMPLANT
SUT BONE WAX W31G (SUTURE) ×2 IMPLANT
SUT MNCRL AB 3-0 PS2 18 (SUTURE) ×2 IMPLANT
SUT STRATAFIX 0 PDS 27 VIOLET (SUTURE) ×2
SUT STRATAFIX PDS+ 0 24IN (SUTURE) ×2 IMPLANT
SUT VIC AB 1 CT1 36 (SUTURE) ×2 IMPLANT
SUTURE STRATFX 0 PDS 27 VIOLET (SUTURE) ×1 IMPLANT
SYR 30ML LL (SYRINGE) IMPLANT
TIBIA STEM 5 DEG SZ G L KNEE (Knees) ×2 IMPLANT
TRAY FOLEY MTR SLVR 16FR STAT (SET/KITS/TRAYS/PACK) ×2 IMPLANT
WATER STERILE IRR 1000ML POUR (IV SOLUTION) ×4 IMPLANT
WRAP KNEE MAXI GEL POST OP (GAUZE/BANDAGES/DRESSINGS) ×2 IMPLANT

## 2020-04-14 NOTE — Progress Notes (Addendum)
Dr. Vickey Huger paged regarding patient's yellow mews. Alcide Evener PA came saw patient. No new orders at this time. Continue to monitor patient.

## 2020-04-14 NOTE — Anesthesia Procedure Notes (Signed)
Procedure Name: MAC Date/Time: 04/14/2020 8:15 AM Performed by: Michele Rockers, CRNA Pre-anesthesia Checklist: Patient identified, Emergency Drugs available, Suction available, Timeout performed and Patient being monitored Patient Re-evaluated:Patient Re-evaluated prior to induction Oxygen Delivery Method: Simple face mask

## 2020-04-14 NOTE — Plan of Care (Signed)
Patient admited to unit all careplans initiated

## 2020-04-14 NOTE — Anesthesia Procedure Notes (Signed)
Spinal  Start time: 04/14/2020 8:16 AM End time: 04/14/2020 8:18 AM Staffing Performed: anesthesiologist  Anesthesiologist: Effie Berkshire, MD Preanesthetic Checklist Completed: patient identified, IV checked, site marked, risks and benefits discussed, surgical consent, monitors and equipment checked, pre-op evaluation and timeout performed Spinal Block Patient position: sitting Prep: DuraPrep and site prepped and draped Location: L3-4 Injection technique: single-shot Needle Needle type: Pencan  Needle gauge: 24 G Needle length: 10 cm Needle insertion depth: 10 cm Additional Notes Patient tolerated well. No immediate complications.

## 2020-04-14 NOTE — Evaluation (Signed)
Physical Therapy Evaluation Patient Details Name: Matthew Dickerson MRN: [id number] DOB: 03/09/1955 Today's Date: 04/14/2020   History of Present Illness  patient is a 65 y.o. male s/p Lt  TKA on 04/14/2020 with PMH significant for PVD, aortic aneurysm, anginal pain, melanoma, OA, GERD, Rt/ LT knee surgery, Rt shoulder, and wrist surgery.  Clinical Impression   Pt is a 64y.o. male s/p Lt TKA POD 0. Pt reports that he is modified independent with use of RW and SPC for mobility at baseline. Pt was able to perform supine <> sit with supervision for safety and use of B UEst. At EOB pt's HR reached 128bpm and BP was found to be 138/107 and pt became diaphoretic. Pt was assisted back to supine and BP was 166/107bpm. PT deferred further moblity 2/2 HTN, tachycardia, and onset of diaphoresis, RN aware and present at end of session. Pt will have assistance from his wife and children at home. Pt will benefit from skilled PT to increase independence and safety with mobility. Acute therapy to follow up during stay.     Follow Up Recommendations Follow surgeon's recommendation for DC plan and follow-up therapies;Home health PT    Equipment Recommendations  None recommended by PT (pt owns RW)    Recommendations for Other Services       Precautions / Restrictions Precautions Precautions: Fall Restrictions Weight Bearing Restrictions: No Other Position/Activity Restrictions: WBAT      Mobility  Bed Mobility Overal bed mobility: Needs Assistance Bed Mobility: Supine to Sit;Sit to Supine     Supine to sit: Supervision;HOB elevated Sit to supine: Supervision;HOB elevated   General bed mobility comments: pt with use of bed rail and B UEs to scoo to EOB with supervision for safety. At EOB pt's HR reached up to 128bpm and BP was 138/107 after ~2-31min of sitting and pt became diaphoretic. Pt was assisted back to supine and pt was able to use Rt LE to assist Lt LE up onto bed. In supine, pt's BP found to  be 166/133mmHG. [place], [place], RN present during end of session.    Transfers                    Ambulation/Gait                Stairs            Wheelchair Mobility    Modified Rankin (Stroke Patients Only)       Balance Overall balance assessment: Needs assistance Sitting-balance support: Feet supported Sitting balance-Leahy Scale: Good                                       Pertinent Vitals/Pain Pain Assessment: 0-10 Pain Score: 5  Pain Location: Lt knee Pain Descriptors / Indicators: Sore;Discomfort Pain Intervention(s): Limited activity within patient's tolerance;Monitored during session;Repositioned    Home Living Family/patient expects to be discharged to:: Private residence Living Arrangements: Spouse/significant other;Other relatives Available Help at Discharge: Family Type of Home: House Home Access: Stairs to enter Entrance Stairs-Rails: Can reach both Entrance Stairs-Number of Steps: 3 Home Layout: One level Home Equipment: [place] - 2 wheels;Cane - single point Additional Comments: pt lives with his wife who is available to assist and has children who are available intermittently.    Prior Function Level of Independence: Independent with assistive device(s)         Comments:  use of cane and RW     Hand Dominance   Dominant Hand: Right    Extremity/Trunk Assessment   Upper Extremity Assessment Upper Extremity Assessment: Overall WFL for tasks assessed    Lower Extremity Assessment Lower Extremity Assessment: LLE deficits/detail LLE Deficits / Details: pt with fair quad set strength and 4/5 dorsi/plantar flexion strength. Pt unable to initiate SLR 2/2 pain. LLE: Unable to fully assess due to pain LLE Sensation: WNL LLE Coordination: WNL    Cervical / Trunk Assessment Cervical / Trunk Assessment: Normal  Communication   Communication: No difficulties  Cognition Arousal/Alertness:  Awake/alert Behavior During Therapy: WFL for tasks assessed/performed Overall Cognitive Status: Within Functional Limits for tasks assessed                                        General Comments      Exercises     Assessment/Plan    PT Assessment Patient needs continued PT services  PT Problem List Decreased strength;Decreased range of motion;Decreased activity tolerance;Decreased mobility;Decreased knowledge of use of DME;Cardiopulmonary status limiting activity;Pain;Decreased balance       PT Treatment Interventions DME instruction;Gait training;Stair training;Therapeutic activities;Balance training;Functional mobility training;Therapeutic exercise;Patient/family education    PT Goals (Current goals can be found in the Care Plan section)  Acute Rehab PT Goals Patient Stated Goal: pt wants to regain mobility and independence to be more active again and return home PT Goal Formulation: With patient/family Time For Goal Achievement: 04/21/20 Potential to Achieve Goals: Good    Frequency 7X/week   Barriers to discharge        Co-evaluation               AM-PAC PT "6 Clicks" Mobility  Outcome Measure Help needed turning from your back to your side while in a flat bed without using bedrails?: None Help needed moving from lying on your back to sitting on the side of a flat bed without using bedrails?: A Little Help needed moving to and from a bed to a chair (including a wheelchair)?: A Lot Help needed standing up from a chair using your arms (e.g., wheelchair or bedside chair)?: A Lot Help needed to walk in hospital room?: A Lot   6 Click Score: 13    End of Session   Activity Tolerance: Treatment limited secondary to medical complications (Comment) (HTN, tachycardia, and diaphoresis) Patient left: in bed;with call bell/phone within reach;with bed alarm set;with family/visitor present Nurse Communication: Mobility status (vitals and that pt became  diaphoretic during session) PT Visit Diagnosis: Muscle weakness (generalized) (M62.81);Pain;Unsteadiness on feet (R26.81) Pain - Right/Left: Left Pain - part of body: Knee    Time: 1536-1600 PT Time Calculation (min) (ACUTE ONLY): 24 min   Charges:              Elna Breslow, SPT  Acute rehab    Elna Breslow 04/14/2020, 6:45 PM

## 2020-04-14 NOTE — Anesthesia Procedure Notes (Signed)
Anesthesia Regional Block: Adductor canal block   Pre-Anesthetic Checklist: ,, timeout performed, Correct Patient, Correct Site, Correct Laterality, Correct Procedure, Correct Position, site marked, Risks and benefits discussed,  Surgical consent,  Pre-op evaluation,  At surgeon's request and post-op pain management  Laterality: Left  Prep: chloraprep       Needles:  Injection technique: Single-shot  Needle Type: Echogenic Stimulator Needle     Needle Length: 9cm  Needle Gauge: 21     Additional Needles:   Procedures:,,,, ultrasound used (permanent image in chart),,,,  Narrative:  Start time: 04/14/2020 7:30 AM End time: 04/14/2020 7:35 AM Injection made incrementally with aspirations every 5 mL.  Performed by: Personally  Anesthesiologist: Effie Berkshire, MD  Additional Notes: Patient tolerated the procedure well. Local anesthetic introduced in an incremental fashion under minimal resistance after negative aspirations. No paresthesias were elicited. After completion of the procedure, no acute issues were identified and patient continued to be monitored by RN.
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
## 2020-04-14 NOTE — Progress Notes (Signed)
Assisted Dr. Hollis with left, ultrasound guided, adductor canal block. Side rails up, monitors on throughout procedure. See vital signs in flow sheet. Tolerated Procedure well.  

## 2020-04-14 NOTE — Transfer of Care (Signed)
Immediate Anesthesia Transfer of Care Note  Patient: Matthew Dickerson  Procedure(s) Performed: TOTAL KNEE ARTHROPLASTY (Left Knee)  Patient Location: PACU  Anesthesia Type:Spinal  Level of Consciousness: drowsy, patient cooperative and responds to stimulation  Airway & Oxygen Therapy: Patient Spontanous Breathing and Patient connected to nasal cannula oxygen  Post-op Assessment: Report given to RN and Post -op Vital signs reviewed and stable  Post vital signs: Reviewed and stable  Last Vitals:  Vitals Value Taken Time  BP    Temp    Pulse 79 04/14/20 1034  Resp 15 04/14/20 1035  SpO2 100 % 04/14/20 1034  Vitals shown include unvalidated device data.  Last Pain:  Vitals:   04/14/20 0651  TempSrc:   PainSc: 0-No pain         Complications: No complications documented.

## 2020-04-14 NOTE — Op Note (Signed)
TOTAL KNEE REPLACEMENT OPERATIVE NOTE:  04/14/2020  11:19 AM  PATIENT:  Matthew Dickerson  65 y.o. male  PRE-OPERATIVE DIAGNOSIS:  Osteoarthritis of left knee M17.12  POST-OPERATIVE DIAGNOSIS:  Osteoarthritis of left knee M17.12  PROCEDURE:  Procedure(s): TOTAL KNEE ARTHROPLASTY  SURGEON:  Surgeon(s): Vickey Huger, MD  PHYSICIAN ASSISTANT:  Nehemiah Massed, PA-C  ANESTHESIA:   spinal  SPECIMEN: None  COUNTS:  Correct  TOURNIQUET:   Total Tourniquet Time Documented: Thigh (Left) - 50 minutes Total: Thigh (Left) - 50 minutes   DICTATION:  Indication for procedure:    The patient is a 65 y.o. male who has failed conservative treatment for Osteoarthritis of left knee M17.12.  Informed consent was obtained prior to anesthesia. The risks versus benefits of the operation were explain and in a way the patient can, and did, understand.   Description of procedure:     The patient was taken to the operating room and placed under anesthesia.  The patient was positioned in the usual fashion taking care that all body parts were adequately padded and/or protected.  A tourniquet was applied and the leg prepped and draped in the usual sterile fashion.  The extremity was exsanguinated with the esmarch and tourniquet inflated to 350 mmHg.  Pre-operative range of motion was normal.    A midline incision approximately 6-7 inches long was made with a #10 blade.  A new blade was used to make a parapatellar arthrotomy going 2-3 cm into the quadriceps tendon, over the patella, and alongside the medial aspect of the patellar tendon.  A synovectomy was then performed with the #10 blade and forceps. I then elevated the deep MCL off the medial tibial metaphysis subperiosteally around to the semimembranosus attachment.    I everted the patella and used calipers to measure patellar thickness.  I used the reamer to ream down to appropriate thickness to recreate the native thickness.  I then removed excess  bone with the rongeur and sagittal saw.  I used the appropriately sized template and drilled the three lug holes.  I then put the trial in place and measured the thickness with the calipers to ensure recreation of the native thickness.  The trial was then removed and the patella subluxed and the knee brought into flexion.  A homan retractor was place to retract and protect the patella and lateral structures.  A Z-retractor was place medially to protect the medial structures.  The extra-medullary alignment system was used to make cut the tibial articular surface perpendicular to the anamotic axis of the tibia and in 3 degrees of posterior slope.  The cut surface and alignment jig was removed.  I then used the intramedullary alignment guide to make a  valgus cut on the distal femur.  I then marked out the epicondylar axis on the distal femur.  I then used the anterior referencing sizer and measured the femur to be a size 11.  The 4-In-1 cutting block was screwed into place in external rotation matching the posterior condylar angle, making our cuts perpendicular to the epicondylar axis.  Anterior, posterior and chamfer cuts were made with the sagittal saw.  The cutting block and cut pieces were removed.  A lamina spreader was placed in 90 degrees of flexion.  The ACL, PCL, menisci, and posterior condylar osteophytes were removed.  A 11 mm spacer blocked was found to offer good flexion and extension gap balance after minimal in degree releasing.   The scoop retractor was then placed  and the femoral finishing block was pinned in place.  The small sagittal saw was used as well as the lug drill to finish the femur.  The block and cut surfaces were removed and the medullary canal hole filled with autograft bone from the cut pieces.  The tibia was delivered forward in deep flexion and external rotation.  A size G tray was selected and pinned into place centered on the medial 1/3 of the tibial tubercle.  The reamer  and keel was used to prepare the tibia through the tray.    I then trialed with the size 11 femur, size G tibia, a 11 mm insert and the 35 patella.  I had excellent flexion/extension gap balance, excellent patella tracking.  Flexion was full and beyond 120 degrees; extension was zero.  These components were chosen and the staff opened them to me on the back table while the knee was lavaged copiously and the cement mixed.  The soft tissue was infiltrated with 60cc of exparel 1.3% through a 21 gauge needle.  I cemented in the components and removed all excess cement.  The polyethylene tibial component was snapped into place and the knee placed in extension while cement was hardening.  The capsule was infilltrated with a 60cc exparel/marcaine/saline mixture.   Once the cement was hard, the tourniquet was let down.  Hemostasis was obtained.  The arthrotomy was closed using a #1 stratofix running suture.  The deep soft tissues were closed with #0 vicryls and the subcuticular layer closed with #2-0 vicryl.  The skin was reapproximated and closed with 3.0 Monocryl.  The wound was covered with steristrips, aquacel dressing, and a TED stocking.   The patient was then awakened, extubated, and taken to the recovery room in stable condition.  BLOOD LOSS:  789FY COMPLICATIONS:  None.  PLAN OF CARE: Discharge to home after PACU  PATIENT DISPOSITION:  PACU - hemodynamically stable.    Please fax a copy of this op note to my office at [phone] (please only include page 1 and 2 of the Case Information op note)

## 2020-04-14 NOTE — Progress Notes (Signed)
   04/14/20 1442  Assess: MEWS Score  Temp (!) 97.5 F (36.4 C)  BP (!) 169/96  Pulse Rate (!) 113  Resp 19  SpO2 100 %  Assess: MEWS Score  MEWS Temp 0  MEWS Systolic 0  MEWS Pulse 2  MEWS RR 0  MEWS LOC 0  MEWS Score 2  MEWS Score Color Yellow  Assess: if the MEWS score is Yellow or Red  Were vital signs taken at a resting state? Yes  Focused Assessment No change from prior assessment  Early Detection of Sepsis Score *See Row Information* Low  MEWS guidelines implemented *See Row Information* Yes  Treat  MEWS Interventions Escalated (See documentation below)  Take Vital Signs  Increase Vital Sign Frequency  Yellow: Q 2hr X 2 then Q 4hr X 2, if remains yellow, continue Q 4hrs  Escalate  MEWS: Escalate Yellow: discuss with charge nurse/RN and consider discussing with provider and RRT (monitoring-will notify physician if it doesnt resolve)  Notify: Charge Nurse/RN  Name of Charge Nurse/RN Notified JT  Date Charge Nurse/RN Notified 04/14/20  Time Charge Nurse/RN Notified 1504  Notify: Provider  Notification Reason  (elevated hr)

## 2020-04-14 NOTE — Anesthesia Postprocedure Evaluation (Signed)
Anesthesia Post Note  Patient: RITHY MANDLEY  Procedure(s) Performed: TOTAL KNEE ARTHROPLASTY (Left Knee)     Patient location during evaluation: PACU Anesthesia Type: Spinal Level of consciousness: oriented and awake and alert Pain management: pain level controlled Vital Signs Assessment: post-procedure vital signs reviewed and stable Respiratory status: spontaneous breathing, respiratory function stable and patient connected to nasal cannula oxygen Cardiovascular status: blood pressure returned to baseline and stable Postop Assessment: no headache, no backache, no apparent nausea or vomiting and spinal receding Anesthetic complications: no   No complications documented.  Last Vitals:  Vitals:   04/14/20 1348 04/14/20 1442  BP: (!) 145/84 (!) 169/96  Pulse: (!) 108 (!) 113  Resp: 16 19  Temp: (!) 36.3 C (!) 36.4 C  SpO2: 98% 100%    Last Pain:  Vitals:   04/14/20 1442  TempSrc: Oral  PainSc:                  Effie Berkshire

## 2020-04-15 ENCOUNTER — Encounter (HOSPITAL_COMMUNITY): Payer: Self-pay | Admitting: Orthopedic Surgery

## 2020-04-15 DIAGNOSIS — M1712 Unilateral primary osteoarthritis, left knee: Secondary | ICD-10-CM | POA: Diagnosis not present

## 2020-04-15 LAB — BASIC METABOLIC PANEL
Anion gap: 8 (ref 5–15)
BUN: 15 mg/dL (ref 8–23)
CO2: 23 mmol/L (ref 22–32)
Calcium: 9 mg/dL (ref 8.9–10.3)
Chloride: 105 mmol/L (ref 98–111)
Creatinine, Ser: 0.93 mg/dL (ref 0.61–1.24)
GFR, Estimated: >60 mL/min (ref >60)
Glucose, Bld: 131 mg/dL — ABNORMAL HIGH (ref 70–99)
Potassium: 4.4 mmol/L (ref 3.5–5.1)
Sodium: 136 mmol/L (ref 135–145)

## 2020-04-15 LAB — CBC
HCT: 38.3 % — ABNORMAL LOW (ref 39.0–52.0)
Hemoglobin: 13 g/dL (ref 13.0–17.0)
MCH: 29.4 pg (ref 26.0–34.0)
MCHC: 33.9 g/dL (ref 30.0–36.0)
MCV: 86.7 fL (ref 80.0–100.0)
Platelets: 218 10*3/uL (ref 150–400)
RBC: 4.42 MIL/uL (ref 4.22–5.81)
RDW: 12.5 % (ref 11.5–15.5)
WBC: 14.7 10*3/uL — ABNORMAL HIGH (ref 4.0–10.5)
nRBC: 0 % (ref 0.0–0.2)

## 2020-04-15 MED ORDER — OXYCODONE HCL 5 MG PO TABS
5.0000 mg | ORAL_TABLET | ORAL | 0 refills | Status: DC | PRN
Start: 2020-04-15 — End: 2020-06-24

## 2020-04-15 MED ORDER — METHOCARBAMOL 500 MG PO TABS: 500.0000 mg | ORAL_TABLET | Freq: Four times a day (QID) | ORAL | 0 refills | Status: DC | PRN

## 2020-04-15 MED ORDER — ASPIRIN 325 MG PO TBEC: 325.0000 mg | DELAYED_RELEASE_TABLET | Freq: Two times a day (BID) | ORAL | 0 refills | Status: DC

## 2020-04-15 MED ORDER — GABAPENTIN 300 MG PO CAPS: 300.0000 mg | ORAL_CAPSULE | Freq: Three times a day (TID) | ORAL | 0 refills | Status: DC

## 2020-04-15 NOTE — Plan of Care (Signed)
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
## 2020-04-15 NOTE — Progress Notes (Signed)
Physical Therapy Treatment Patient Details Name: Matthew Dickerson MRN: [id number] DOB: Nov 24, 1955 Today's Date: 04/15/2020    History of Present Illness Patient is a 65 y.o. male s/p Lt  TKA on 04/14/2020 with PMH significant for PVD, aortic aneurysm, anginal pain, melanoma, OA, GERD, Rt/ LT knee surgery, Rt shoulder, and wrist surgery.    PT Comments    Pt ambulated in hallway and practiced safe stair technique.  Pt provided with HEP and stair handouts.  Pt encouraged to perform active knee extension and flexion however rest with knee straight.  Pt plans to f/u with OPPT upon d/c.  Pt eager for d/c home.   Follow Up Recommendations  Follow surgeon's recommendation for DC plan and follow-up therapies;Home health PT     Equipment Recommendations  None recommended by PT    Recommendations for Other Services       Precautions / Restrictions Precautions Precautions: Fall;Knee Restrictions Other Position/Activity Restrictions: WBAT    Mobility  Bed Mobility Overal bed mobility: Needs Assistance Bed Mobility: Supine to Sit     Supine to sit: Supervision          Transfers Overall transfer level: Needs assistance Equipment used: Rolling walker (2 wheeled) Transfers: Sit to/from Stand Sit to Stand: Min guard         General transfer comment: min/guard for safety, cues for UE and LE positioning  Ambulation/Gait Ambulation/Gait assistance: Min guard Gait Distance (Feet): 200 Feet Assistive device: Rolling walker (2 wheeled) Gait Pattern/deviations: Step-to pattern;Antalgic;Decreased stance time - left     General Gait Details: verbal cues for sequence, RW positioning, step length; cues for slowing pace and safety   Stairs Stairs: Yes Stairs assistance: Supervision Stair Management: Step to pattern;Backwards;With walker Number of Stairs: 2 General stair comments: verbal cues for sequence and safety; pt typically leaves RW in one place and moves over both steps,  cautioned against this for safety (take one step at a time); provided handout   Wheelchair Mobility    Modified Rankin (Stroke Patients Only)       Balance                                            Cognition Arousal/Alertness: Awake/alert Behavior During Therapy: WFL for tasks assessed/performed Overall Cognitive Status: Within Functional Limits for tasks assessed                                        Exercises      General Comments        Pertinent Vitals/Pain Pain Assessment: 0-10 Pain Score: 3  Pain Location: Lt knee Pain Descriptors / Indicators: Sore;Discomfort Pain Intervention(s): Repositioned;Monitored during session    Home Living                      Prior Function            PT Goals (current goals can now be found in the care plan section) Progress towards PT goals: Progressing toward goals    Frequency    7X/week      PT Plan Current plan remains appropriate    Co-evaluation              AM-PAC PT "6 Clicks" Mobility   Outcome Measure  Help needed turning from your back to your side while in a flat bed without using bedrails?: None Help needed moving from lying on your back to sitting on the side of a flat bed without using bedrails?: A Little Help needed moving to and from a bed to a chair (including a wheelchair)?: A Little Help needed standing up from a chair using your arms (e.g., wheelchair or bedside chair)?: A Little Help needed to walk in hospital room?: A Little Help needed climbing 3-5 steps with a railing? : A Little 6 Click Score: 19    End of Session Equipment Utilized During Treatment: Gait belt Activity Tolerance: Patient tolerated treatment well Patient left: in chair;with call bell/phone within reach;with family/visitor present   PT Visit Diagnosis: Muscle weakness (generalized) (M62.81);Difficulty in walking, not elsewhere classified (R26.2)     Time:  0228-4069 PT Time Calculation (min) (ACUTE ONLY): 22 min  Charges:  $Gait Training: 8-22 mins                     Arlyce Dice, DPT Acute Rehabilitation Services Pager: [phone] Office: [phone]  Trena Platt 04/15/2020, 12:50 PM

## 2020-04-15 NOTE — Progress Notes (Signed)
SPORTS MEDICINE AND JOINT REPLACEMENT  Lara Mulch, MD    Carlyon Shadow, PA-C [address], [place], [place]  [address]                             [phone]   PROGRESS NOTE  Subjective:  negative for Chest Pain  negative for Shortness of Breath  negative for Nausea/Vomiting   negative for Calf Pain  negative for Bowel Movement   Tolerating Diet: yes         Patient reports pain as 3 on 0-10 scale.    Objective: Vital signs in last 24 hours:   Patient Vitals for the past 24 hrs:  BP Temp Temp src Pulse Resp SpO2 Height Weight  04/15/20 0933 (!) 165/86 99 F (37.2 C) Oral 78 16 96 % -- --  04/15/20 0600 (!) 147/87 98.2 F (36.8 C) Oral -- 20 100 % -- --  04/15/20 0217 133/71 98.3 F (36.8 C) Oral 91 17 95 % -- --  04/14/20 2100 138/79 98.2 F (36.8 C) Oral 94 17 95 % -- --  04/14/20 1900 (!) 165/91 98 F (36.7 C) Oral (!) 116 20 94 % -- --  04/14/20 1718 (!) 155/100 98.5 F (36.9 C) Oral (!) 117 18 95 % -- --  04/14/20 1537 (!) 168/98 97.9 F (36.6 C) Oral (!) 113 18 98 % -- --  04/14/20 1442 (!) 169/96 (!) 97.5 F (36.4 C) Oral (!) 113 19 100 % -- --  04/14/20 1348 (!) 145/84 (!) 97.4 F (36.3 C) Oral (!) 108 16 98 % -- --  04/14/20 1250 (!) 137/95 (!) 97.5 F (36.4 C) Axillary 93 16 -- 6' (1.829 m) 110.2 kg  04/14/20 1235 (!) 137/95 (!) 97.5 F (36.4 C) Axillary 93 16 99 % -- --  04/14/20 1225 -- -- -- 89 18 100 % -- --    @flow {1959:LAST@   Intake/Output from previous day:   02/21 0701 - 02/22 0700 In: 4519.2 [P.O.:910; I.V.:3159.2] Out: 2350 [Urine:2250]   Intake/Output this shift:   02/22 0701 - 02/22 1900 In: 240 [P.O.:240] Out: 250 [Urine:250]   Intake/Output      02/21 0701 02/22 0700 02/22 0701 02/23 0700   P.O. 910 240   I.V. (mL/kg) 3159.2 (28.7)    IV Piggyback 450    Total Intake(mL/kg) 4519.2 (41) 240 (2.2)   Urine (mL/kg/hr) 2250 (0.9) 250 (0.4)   Stool 0    Blood 100    Total Output 2350 250   Net +2169.2 -10         Urine Occurrence 0 x 1 x   Stool Occurrence 0 x       LABORATORY DATA: Recent Labs    04/15/20 0317  WBC 14.7*  HGB 13.0  HCT 38.3*  PLT 218   Recent Labs    04/15/20 0317  NA 136  K 4.4  CL 105  CO2 23  BUN 15  CREATININE 0.93  GLUCOSE 131*  CALCIUM 9.0   No results found for: INR, PROTIME  Examination:  General appearance: alert, cooperative and no distress Extremities: extremities normal, atraumatic, no cyanosis or edema  Wound Exam: clean, dry, intact   Drainage:  None: wound tissue dry  Motor Exam: Quadriceps and Hamstrings Intact  Sensory Exam: Superficial Peroneal, Deep Peroneal and Tibial normal   Assessment:    1 Day Post-Op  Procedure(s) (LRB): TOTAL KNEE ARTHROPLASTY (Left)  ADDITIONAL  DIAGNOSIS:  Active Problems:   S/P total knee replacement     Plan: Physical Therapy as ordered Weight Bearing as Tolerated (WBAT)  DVT Prophylaxis:  Aspirin  DISCHARGE PLAN: Home      Patient doing well and ready for D/C home  Patient's anticipated LOS is less than 2 midnights, meeting these requirements: - Younger than 44 - Lives within 1 hour of care - Has a competent adult at home to recover with post-op recover - NO history of  - Chronic pain requiring opiods  - Diabetes  - Coronary Artery Disease  - Heart failure  - Heart attack  - Stroke  - DVT/VTE  - Cardiac arrhythmia  - Respiratory Failure/COPD  - Renal failure  - Anemia  - Advanced Liver disease        Donia Ast 04/15/2020, 12:19 PM

## 2020-04-15 NOTE — TOC Transition Note (Signed)
Transition of Care [facility]) - CM/SW Discharge Note   Patient Details  Name: Matthew Dickerson MRN: [id number] Date of Birth: 1955-11-06  [facility] CM/SW Contact:  Lennart Pall, LCSW Phone Number: 04/15/2020, 10:05 AM   Clinical Narrative:    Met with pt and spouse and confirmed pt has all needed DME at home.  Plan for OPPT at [facility] in [place].  No further TOC needs.   Final next level of care: OP Rehab Barriers to Discharge: No Barriers Identified   Patient Goals and CMS Choice Patient states their goals for this hospitalization and ongoing recovery are:: go home      Discharge Placement                       Discharge Plan and Services                DME Arranged: N/A DME Agency: NA                  Social Determinants of Health (SDOH) Interventions     Readmission Risk Interventions No flowsheet data found.

## 2020-06-05 NOTE — Progress Notes (Addendum)
COVID Vaccine Completed:  x1 Date COVID Vaccine completed: Has received booster: COVID vaccine manufacturer:   [employer]   Date of COVID positive in last 90 days: N/A  PCP - Delman Cheadle, PA-C Cardiologist - Dr. Gilford Raid follows aneurysm  Chest x-ray - CT chest 03-19-20 Epic EKG - N/A Stress Test - N/A ECHO - N/A Cardiac Cath - N/A Pacemaker/ICD device last checked: Spinal Cord Stimulator:  Sleep Study - N/A CPAP -   Fasting Blood Sugar - N/A Checks Blood Sugar _____ times a day  Blood Thinner Instructions: Aspirin Instructions:  ASA 325 mg.  Per pt to stop one week prior Last Dose:  Activity level:   Can go up a flight of stairs and perform activities of daily living without stopping and without symptoms of chest pain or shortness of breat     Anesthesia review:  4.2 cm ascending aortic aneurysm followed by Gilford Raid, MD  Patient denies shortness of breath, fever, cough and chest pain at PAT appointment (completed over the phone)   Patient verbalized understanding of instructions that were given to them at the PAT appointment. Patient was also instructed that they will need to review over the PAT instructions again at home before surgery.

## 2020-06-05 NOTE — Patient Instructions (Addendum)
DUE TO COVID-19 ONLY ONE VISITOR IS ALLOWED TO COME WITH YOU AND STAY IN THE WAITING ROOM ONLY DURING PRE OP AND PROCEDURE.   **NO VISITORS ARE ALLOWED IN THE SHORT STAY AREA OR RECOVERY ROOM!!**    COVID SWAB TESTING MUST BE COMPLETED ON:  Thursday, 06-19-20 @ 10:05    [address] [place], [place] [address]  (Must self quarantine after testing. Follow instructions on handout.)  [facility] [place] (Must self quarantine after testing. Follow instructions on handout.)       Your procedure is scheduled on: Monday, 06-23-20   Report to [facility] Main  Entrance    Report to admitting at 6:50 AM   Call this number if you have problems the morning of surgery [phone]   Do not eat food :After Midnight.   May have liquids until 6:15 AM day of surgery  CLEAR LIQUID DIET  Foods Allowed                                                                     Foods Excluded  Water, Black Coffee and tea, regular and decaf             liquids that you cannot  Plain Jell-O in any flavor  (No red)                                    see through such as: Fruit ices (not with fruit pulp)                                      milk, soups, orange juice              Iced Popsicles (No red)                                      All solid food                                   Apple juices Sports drinks like Gatorade (No red) Lightly seasoned clear broth or consume(fat free) Sugar, honey syrup    Complete one Ensure drink the morning of surgery at 6:15 AM  the day of surgery.      1. The day of surgery:  ? Drink ONE (1) Pre-Surgery Clear Ensure or G2 by am the morning of surgery. Drink in one sitting. Do not sip.  ? This drink was given to you during your hospital  pre-op appointment visit. ? Nothing else to drink after completing the  Pre-Surgery Clear Ensure or G2.          If you have questions, please contact your surgeon's  office.     Oral Hygiene is also important to reduce your risk of infection.  Remember - BRUSH YOUR TEETH THE MORNING OF SURGERY WITH YOUR REGULAR TOOTHPASTE   Do NOT smoke after Midnight   Take these medicines the morning of surgery with A SIP OF WATER: Omeprazole                               You may not have any metal on your body including  jewelry, and body piercings             Do not wear lotions, powders, cologne, or deodorant             Men may shave face and neck.   Do not bring valuables to the hospital. [place].   Contacts, dentures or bridgework may not be worn into surgery.   Patients discharged the day of surgery will not be allowed to drive home.                Please read over the following fact sheets you were given: IF YOU HAVE QUESTIONS ABOUT YOUR PRE OP INSTRUCTIONS PLEASE CALL  [facility] - Preparing for Surgery Before surgery, you can play an important role.  Because skin is not sterile, your skin needs to be as free of germs as possible.  You can reduce the number of germs on your skin by washing with CHG (chlorahexidine gluconate) soap before surgery.  CHG is an antiseptic cleaner which kills germs and bonds with the skin to continue killing germs even after washing. Please DO NOT use if you have an allergy to CHG or antibacterial soaps.  If your skin becomes reddened/irritated stop using the CHG and inform your nurse when you arrive at Short Stay. Do not shave (including legs and underarms) for at least 48 hours prior to the first CHG shower.  You may shave your face/neck.  Please follow these instructions carefully:  1.  Shower with CHG Soap the night before surgery and the  morning of surgery.  2.  If you choose to wash your hair, wash your hair first as usual with your normal  shampoo.  3.  After you shampoo, rinse your hair and body thoroughly to  remove the shampoo.                             4.  Use CHG as you would any other liquid soap.  You can apply chg directly to the skin and wash.  Gently with a scrungie or clean washcloth.  5.  Apply the CHG Soap to your body ONLY FROM THE NECK DOWN.   Do   not use on face/ open                           Wound or open sores. Avoid contact with eyes, ears mouth and   genitals (private parts).                       Wash face,  Genitals (private parts) with your normal soap.             6.  Wash thoroughly, paying special attention to the area where your    surgery  will be performed.  7.  Thoroughly  rinse your body with warm water from the neck down.  8.  DO NOT shower/wash with your normal soap after using and rinsing off the CHG Soap.                9.  Pat yourself dry with a clean towel.            10.  Wear clean pajamas.            11.  Place clean sheets on your bed the night of your first shower and do not  sleep with pets. Day of Surgery : Do not apply any lotions/deodorants the morning of surgery.  Please wear clean clothes to the hospital/surgery center.  FAILURE TO FOLLOW THESE INSTRUCTIONS MAY RESULT IN THE CANCELLATION OF YOUR SURGERY  PATIENT SIGNATURE_________________________________  NURSE SIGNATURE__________________________________  ________________________________________________________________________

## 2020-06-12 NOTE — Progress Notes (Signed)
Please enter orders for PAT visit scheduled for 06-17-20.

## 2020-06-16 ENCOUNTER — Other Ambulatory Visit: Payer: Self-pay | Admitting: Orthopedic Surgery

## 2020-06-17 ENCOUNTER — Encounter (HOSPITAL_COMMUNITY)
Admission: RE | Admit: 2020-06-17 | Discharge: 2020-06-17 | Disposition: A | Discharge status: Home / Self Care | Payer: Medicare Other | Source: Ambulatory Visit | Attending: Orthopedic Surgery | Admitting: Orthopedic Surgery

## 2020-06-17 ENCOUNTER — Other Ambulatory Visit: Payer: Self-pay

## 2020-06-17 ENCOUNTER — Encounter (HOSPITAL_COMMUNITY): Payer: Self-pay

## 2020-06-19 ENCOUNTER — Other Ambulatory Visit (HOSPITAL_COMMUNITY)
Admission: RE | Admit: 2020-06-19 | Discharge: 2020-06-19 | Disposition: A | Discharge status: Home / Self Care | Payer: Medicare Other | Source: Ambulatory Visit | Attending: Orthopedic Surgery | Admitting: Orthopedic Surgery

## 2020-06-19 ENCOUNTER — Other Ambulatory Visit: Payer: Self-pay

## 2020-06-19 ENCOUNTER — Encounter (HOSPITAL_COMMUNITY)
Admission: RE | Admit: 2020-06-19 | Discharge: 2020-06-19 | Disposition: A | Discharge status: Home / Self Care | Payer: Medicare Other | Source: Ambulatory Visit | Attending: Orthopedic Surgery | Admitting: Orthopedic Surgery

## 2020-06-19 DIAGNOSIS — Z20822 Contact with and (suspected) exposure to covid-19: Secondary | ICD-10-CM | POA: Insufficient documentation

## 2020-06-19 DIAGNOSIS — Z01812 Encounter for preprocedural laboratory examination: Secondary | ICD-10-CM | POA: Insufficient documentation

## 2020-06-19 LAB — COMPREHENSIVE METABOLIC PANEL
ALT: 12 U/L (ref 0–44)
AST: 12 U/L — ABNORMAL LOW (ref 15–41)
Albumin: 4 g/dL (ref 3.5–5.0)
Alkaline Phosphatase: 108 U/L (ref 38–126)
Anion gap: 7 (ref 5–15)
BUN: 14 mg/dL (ref 8–23)
CO2: 25 mmol/L (ref 22–32)
Calcium: 9.3 mg/dL (ref 8.9–10.3)
Chloride: 105 mmol/L (ref 98–111)
Creatinine, Ser: 0.97 mg/dL (ref 0.61–1.24)
GFR, Estimated: >60 mL/min (ref >60)
Glucose, Bld: 112 mg/dL — ABNORMAL HIGH (ref 70–99)
Potassium: 3.8 mmol/L (ref 3.5–5.1)
Sodium: 137 mmol/L (ref 135–145)
Total Bilirubin: 0.6 mg/dL (ref 0.3–1.2)
Total Protein: 7.7 g/dL (ref 6.5–8.1)

## 2020-06-19 LAB — CBC WITH DIFFERENTIAL/PLATELET
Abs Immature Granulocytes: 0.02 10*3/uL (ref 0.00–0.07)
Basophils Absolute: 0 10*3/uL (ref 0.0–0.1)
Basophils Relative: 1 %
Eosinophils Absolute: 0.1 10*3/uL (ref 0.0–0.5)
Eosinophils Relative: 1 %
HCT: 48.1 % (ref 39.0–52.0)
Hemoglobin: 16 g/dL (ref 13.0–17.0)
Immature Granulocytes: 0 %
Lymphocytes Relative: 24 %
Lymphs Abs: 2.2 10*3/uL (ref 0.7–4.0)
MCH: 28.6 pg (ref 26.0–34.0)
MCHC: 33.3 g/dL (ref 30.0–36.0)
MCV: 86 fL (ref 80.0–100.0)
Monocytes Absolute: 0.8 10*3/uL (ref 0.1–1.0)
Monocytes Relative: 9 %
Neutro Abs: 5.8 10*3/uL (ref 1.7–7.7)
Neutrophils Relative %: 65 %
Platelets: 286 10*3/uL (ref 150–400)
RBC: 5.59 MIL/uL (ref 4.22–5.81)
RDW: 12.9 % (ref 11.5–15.5)
WBC: 8.9 10*3/uL (ref 4.0–10.5)
nRBC: 0 % (ref 0.0–0.2)

## 2020-06-19 LAB — SARS CORONAVIRUS 2 (TAT 6-24 HRS): SARS Coronavirus 2: NEGATIVE

## 2020-06-19 LAB — SURGICAL PCR SCREEN
MRSA, PCR: NEGATIVE
Staphylococcus aureus: NEGATIVE

## 2020-06-22 MED ORDER — BUPIVACAINE LIPOSOME 1.3 % IJ SUSP
20.0000 mL | Freq: Once | INTRAMUSCULAR | Status: DC
  Filled 2020-06-22: qty 20

## 2020-06-22 NOTE — Anesthesia Preprocedure Evaluation (Addendum)
Anesthesia Evaluation  Patient identified by MRN, date of birth, ID band Patient awake    Reviewed: Allergy & Precautions, NPO status , Patient's Chart, lab work & pertinent test results  History of Anesthesia Complications Negative for: history of anesthetic complications  Airway Mallampati: I  TM Distance: >3 FB Neck ROM: Full    Dental  (+) Edentulous Upper, Poor Dentition, Dental Advisory Given   Pulmonary Patient abstained from smoking., former smoker,    Pulmonary exam normal        Cardiovascular + Peripheral Vascular Disease  Normal cardiovascular exam  Chest CT IMPRESSION: 1. Stable exam. 4.2 cm ascending thoracic aortic aneurysm. Recommend annual imaging followup by CTA or MRA. This recommendation follows 2010 ACCF/AHA/AATS/ACR/ASA/SCA/SCAI/SIR/STS/SVM Guidelines for the Diagnosis and Management of Patients with Thoracic Aortic Disease. Circulation. 2010; 121: Z610-R604. Aortic aneurysm NOS (ICD10-I71.9) 2. Stable 7-8 mm right middle lobe perifissural nodule, consistent with benign etiology. 3. Aortic Atherosclerosis (ICD10-I70.0).     Neuro/Psych negative neurological ROS  negative psych ROS   GI/Hepatic Neg liver ROS, GERD  Medicated,  Endo/Other  negative endocrine ROS  Renal/GU negative Renal ROS     Musculoskeletal  (+) Arthritis ,   Abdominal   Peds  Hematology negative hematology ROS (+)   Anesthesia Other Findings   Reproductive/Obstetrics                            Anesthesia Physical  Anesthesia Plan  ASA: III  Anesthesia Plan: Spinal   Post-op Pain Management:  Regional for Post-op pain   Induction:   PONV Risk Score and Plan: 2 and Ondansetron and Propofol infusion  Airway Management Planned: Natural Airway and Simple Face Mask  Additional Equipment: None  Intra-op Plan:   Post-operative Plan:   Informed Consent: I have reviewed the patients  History and Physical, chart, labs and discussed the procedure including the risks, benefits and alternatives for the proposed anesthesia with the patient or authorized representative who has indicated his/her understanding and acceptance.     Dental advisory given  Plan Discussed with: Anesthesiologist and CRNA  Anesthesia Plan Comments: (     )       Anesthesia Quick Evaluation

## 2020-06-23 ENCOUNTER — Encounter (HOSPITAL_COMMUNITY): Payer: Self-pay | Admitting: Orthopedic Surgery

## 2020-06-23 ENCOUNTER — Other Ambulatory Visit: Payer: Self-pay

## 2020-06-23 ENCOUNTER — Ambulatory Visit (HOSPITAL_COMMUNITY): Payer: Medicare Other | Admitting: Physician Assistant

## 2020-06-23 ENCOUNTER — Encounter (HOSPITAL_COMMUNITY)
Admission: RE | Disposition: A | Discharge status: Home / Self Care | Payer: Self-pay | Source: Home / Self Care | Attending: Orthopedic Surgery

## 2020-06-23 ENCOUNTER — Observation Stay (HOSPITAL_COMMUNITY)
Admission: RE | Admit: 2020-06-23 | Discharge: 2020-06-24 | Disposition: A | Discharge status: Home / Self Care | Payer: Medicare Other | Attending: Orthopedic Surgery | Admitting: Orthopedic Surgery

## 2020-06-23 ENCOUNTER — Ambulatory Visit (HOSPITAL_COMMUNITY): Payer: Medicare Other | Admitting: Anesthesiology

## 2020-06-23 DIAGNOSIS — Z7982 Long term (current) use of aspirin: Secondary | ICD-10-CM | POA: Diagnosis not present

## 2020-06-23 DIAGNOSIS — Z96652 Presence of left artificial knee joint: Secondary | ICD-10-CM | POA: Insufficient documentation

## 2020-06-23 DIAGNOSIS — Z87891 Personal history of nicotine dependence: Secondary | ICD-10-CM | POA: Insufficient documentation

## 2020-06-23 DIAGNOSIS — M1711 Unilateral primary osteoarthritis, right knee: Secondary | ICD-10-CM | POA: Diagnosis present

## 2020-06-23 DIAGNOSIS — Z85828 Personal history of other malignant neoplasm of skin: Secondary | ICD-10-CM | POA: Diagnosis not present

## 2020-06-23 HISTORY — PX: TOTAL KNEE ARTHROPLASTY: SHX125

## 2020-06-23 SURGERY — ARTHROPLASTY, KNEE, TOTAL
Anesthesia: Spinal | Site: Knee | Laterality: Right

## 2020-06-23 MED ORDER — OMEPRAZOLE MAGNESIUM 20 MG PO TBEC: 20.0000 mg | DELAYED_RELEASE_TABLET | Freq: Every day | ORAL | Status: DC

## 2020-06-23 MED ORDER — DEXAMETHASONE SODIUM PHOSPHATE 10 MG/ML IJ SOLN
INTRAMUSCULAR | Status: DC | PRN
  Administered 2020-06-23: 5 mg

## 2020-06-23 MED ORDER — SODIUM CHLORIDE 0.9 % IV SOLN: INTRAVENOUS | Status: DC

## 2020-06-23 MED ORDER — METHOCARBAMOL 500 MG PO TABS
500.0000 mg | ORAL_TABLET | Freq: Four times a day (QID) | ORAL | Status: DC | PRN
  Administered 2020-06-23 – 2020-06-24 (×4): 500 mg via ORAL
  Filled 2020-06-23 (×4): qty 1

## 2020-06-23 MED ORDER — DEXAMETHASONE SODIUM PHOSPHATE 10 MG/ML IJ SOLN
10.0000 mg | Freq: Once | INTRAMUSCULAR | Status: AC
  Administered 2020-06-24: 10 mg via INTRAVENOUS
  Filled 2020-06-23: qty 1

## 2020-06-23 MED ORDER — MENTHOL 3 MG MT LOZG: 1.0000 | LOZENGE | OROMUCOSAL | Status: DC | PRN

## 2020-06-23 MED ORDER — HYDROMORPHONE HCL 1 MG/ML IJ SOLN: 0.5000 mg | INTRAMUSCULAR | Status: DC | PRN

## 2020-06-23 MED ORDER — METHOCARBAMOL 1000 MG/10ML IJ SOLN
500.0000 mg | Freq: Four times a day (QID) | INTRAVENOUS | Status: DC | PRN
  Filled 2020-06-23: qty 5

## 2020-06-23 MED ORDER — BISACODYL 5 MG PO TBEC: 5.0000 mg | DELAYED_RELEASE_TABLET | Freq: Every day | ORAL | Status: DC | PRN

## 2020-06-23 MED ORDER — TRANEXAMIC ACID-NACL 1000-0.7 MG/100ML-% IV SOLN
1000.0000 mg | INTRAVENOUS | Status: AC
  Administered 2020-06-23: 1000 mg via INTRAVENOUS
  Filled 2020-06-23: qty 100

## 2020-06-23 MED ORDER — BUPIVACAINE-EPINEPHRINE 0.25% -1:200000 IJ SOLN
INTRAMUSCULAR | Status: DC | PRN
  Administered 2020-06-23: 30 mL

## 2020-06-23 MED ORDER — CEFAZOLIN SODIUM-DEXTROSE 2-4 GM/100ML-% IV SOLN
2.0000 g | INTRAVENOUS | Status: AC
  Administered 2020-06-23: 2 g via INTRAVENOUS
  Filled 2020-06-23: qty 100

## 2020-06-23 MED ORDER — FERROUS SULFATE 325 (65 FE) MG PO TABS
325.0000 mg | ORAL_TABLET | Freq: Three times a day (TID) | ORAL | Status: DC
  Administered 2020-06-23 – 2020-06-24 (×4): 325 mg via ORAL
  Filled 2020-06-23 (×4): qty 1

## 2020-06-23 MED ORDER — CELECOXIB 200 MG PO CAPS
200.0000 mg | ORAL_CAPSULE | Freq: Once | ORAL | Status: AC
  Administered 2020-06-23: 200 mg via ORAL
  Filled 2020-06-23: qty 1

## 2020-06-23 MED ORDER — SODIUM CHLORIDE 0.9% FLUSH
INTRAVENOUS | Status: DC | PRN
  Administered 2020-06-23: 20 mL

## 2020-06-23 MED ORDER — WATER FOR IRRIGATION, STERILE IR SOLN
Status: DC | PRN
  Administered 2020-06-23: 2000 mL

## 2020-06-23 MED ORDER — ORAL CARE MOUTH RINSE
15.0000 mL | Freq: Once | OROMUCOSAL | Status: AC
  Administered 2020-06-23: 15 mL via OROMUCOSAL

## 2020-06-23 MED ORDER — CEFAZOLIN SODIUM-DEXTROSE 2-4 GM/100ML-% IV SOLN
2.0000 g | Freq: Four times a day (QID) | INTRAVENOUS | Status: AC
  Administered 2020-06-23 (×2): 2 g via INTRAVENOUS
  Filled 2020-06-23 (×2): qty 100

## 2020-06-23 MED ORDER — ASPIRIN EC 325 MG PO TBEC
325.0000 mg | DELAYED_RELEASE_TABLET | Freq: Two times a day (BID) | ORAL | Status: DC
  Administered 2020-06-24: 325 mg via ORAL
  Filled 2020-06-23: qty 1

## 2020-06-23 MED ORDER — CHLORHEXIDINE GLUCONATE 0.12 % MT SOLN: 15.0000 mL | Freq: Once | OROMUCOSAL | Status: AC

## 2020-06-23 MED ORDER — ONDANSETRON HCL 4 MG/2ML IJ SOLN: 4.0000 mg | Freq: Four times a day (QID) | INTRAMUSCULAR | Status: DC | PRN

## 2020-06-23 MED ORDER — TRANEXAMIC ACID-NACL 1000-0.7 MG/100ML-% IV SOLN
1000.0000 mg | Freq: Once | INTRAVENOUS | Status: AC
  Administered 2020-06-23: 1000 mg via INTRAVENOUS
  Filled 2020-06-23: qty 100

## 2020-06-23 MED ORDER — BUPIVACAINE LIPOSOME 1.3 % IJ SUSP
INTRAMUSCULAR | Status: DC | PRN
  Administered 2020-06-23: 20 mL

## 2020-06-23 MED ORDER — METOCLOPRAMIDE HCL 5 MG PO TABS: 5.0000 mg | ORAL_TABLET | Freq: Three times a day (TID) | ORAL | Status: DC | PRN

## 2020-06-23 MED ORDER — ROPIVACAINE HCL 5 MG/ML IJ SOLN
INTRAMUSCULAR | Status: DC | PRN
  Administered 2020-06-23: 15 mL via EPIDURAL

## 2020-06-23 MED ORDER — ACETAMINOPHEN 500 MG PO TABS
1000.0000 mg | ORAL_TABLET | Freq: Once | ORAL | Status: AC
  Administered 2020-06-23: 1000 mg via ORAL
  Filled 2020-06-23: qty 2

## 2020-06-23 MED ORDER — PANTOPRAZOLE SODIUM 40 MG PO TBEC
40.0000 mg | DELAYED_RELEASE_TABLET | Freq: Every day | ORAL | Status: DC
  Administered 2020-06-23 – 2020-06-24 (×2): 40 mg via ORAL
  Filled 2020-06-23 (×2): qty 1

## 2020-06-23 MED ORDER — FENTANYL CITRATE (PF) 100 MCG/2ML IJ SOLN
50.0000 ug | INTRAMUSCULAR | Status: DC
  Administered 2020-06-23: 100 ug via INTRAVENOUS
  Filled 2020-06-23: qty 2

## 2020-06-23 MED ORDER — LACTATED RINGERS IV SOLN: INTRAVENOUS | Status: DC

## 2020-06-23 MED ORDER — PROPOFOL 500 MG/50ML IV EMUL
INTRAVENOUS | Status: DC | PRN
  Administered 2020-06-23: 100 ug/kg/min via INTRAVENOUS

## 2020-06-23 MED ORDER — FLEET ENEMA 7-19 GM/118ML RE ENEM: 1.0000 | ENEMA | Freq: Once | RECTAL | Status: DC | PRN

## 2020-06-23 MED ORDER — SODIUM CHLORIDE 0.9 % IR SOLN
Status: DC | PRN
  Administered 2020-06-23: 1000 mL

## 2020-06-23 MED ORDER — DIPHENHYDRAMINE HCL 12.5 MG/5ML PO ELIX: 12.5000 mg | ORAL_SOLUTION | ORAL | Status: DC | PRN

## 2020-06-23 MED ORDER — SENNOSIDES-DOCUSATE SODIUM 8.6-50 MG PO TABS: 1.0000 | ORAL_TABLET | Freq: Every evening | ORAL | Status: DC | PRN

## 2020-06-23 MED ORDER — BUPIVACAINE-EPINEPHRINE (PF) 0.25% -1:200000 IJ SOLN
INTRAMUSCULAR | Status: AC
  Filled 2020-06-23: qty 30

## 2020-06-23 MED ORDER — EPHEDRINE SULFATE-NACL 50-0.9 MG/10ML-% IV SOSY
PREFILLED_SYRINGE | INTRAVENOUS | Status: DC | PRN
  Administered 2020-06-23: 5 mg via INTRAVENOUS

## 2020-06-23 MED ORDER — METOCLOPRAMIDE HCL 5 MG/ML IJ SOLN
5.0000 mg | Freq: Three times a day (TID) | INTRAMUSCULAR | Status: DC | PRN
Start: 2020-06-23 — End: 2020-06-24

## 2020-06-23 MED ORDER — ACETAMINOPHEN 500 MG PO TABS
1000.0000 mg | ORAL_TABLET | Freq: Four times a day (QID) | ORAL | Status: AC
  Administered 2020-06-23 – 2020-06-24 (×3): 1000 mg via ORAL
  Filled 2020-06-23 (×4): qty 2

## 2020-06-23 MED ORDER — 0.9 % SODIUM CHLORIDE (POUR BTL) OPTIME
TOPICAL | Status: DC | PRN
  Administered 2020-06-23: 1000 mL

## 2020-06-23 MED ORDER — DOCUSATE SODIUM 100 MG PO CAPS
100.0000 mg | ORAL_CAPSULE | Freq: Two times a day (BID) | ORAL | Status: DC
  Administered 2020-06-23 – 2020-06-24 (×2): 100 mg via ORAL
  Filled 2020-06-23 (×2): qty 1

## 2020-06-23 MED ORDER — MIDAZOLAM HCL 2 MG/2ML IJ SOLN
1.0000 mg | INTRAMUSCULAR | Status: DC
  Administered 2020-06-23: 2 mg via INTRAVENOUS
  Filled 2020-06-23: qty 2

## 2020-06-23 MED ORDER — POVIDONE-IODINE 10 % EX SWAB
2.0000 "application " | Freq: Once | CUTANEOUS | Status: AC
  Administered 2020-06-23: 2 via TOPICAL

## 2020-06-23 MED ORDER — OXYCODONE HCL 5 MG PO TABS
5.0000 mg | ORAL_TABLET | ORAL | Status: DC | PRN
  Administered 2020-06-23 – 2020-06-24 (×6): 10 mg via ORAL
  Filled 2020-06-23 (×6): qty 2

## 2020-06-23 MED ORDER — ZOLPIDEM TARTRATE 5 MG PO TABS: 5.0000 mg | ORAL_TABLET | Freq: Every evening | ORAL | Status: DC | PRN

## 2020-06-23 MED ORDER — ONDANSETRON HCL 4 MG PO TABS: 4.0000 mg | ORAL_TABLET | Freq: Four times a day (QID) | ORAL | Status: DC | PRN

## 2020-06-23 MED ORDER — GABAPENTIN 300 MG PO CAPS
300.0000 mg | ORAL_CAPSULE | Freq: Once | ORAL | Status: AC
  Administered 2020-06-23: 300 mg via ORAL
  Filled 2020-06-23: qty 1

## 2020-06-23 MED ORDER — PROPOFOL 1000 MG/100ML IV EMUL
INTRAVENOUS | Status: AC
  Filled 2020-06-23: qty 100

## 2020-06-23 MED ORDER — PHENOL 1.4 % MT LIQD
1.0000 | OROMUCOSAL | Status: DC | PRN
Start: 2020-06-23 — End: 2020-06-24

## 2020-06-23 MED ORDER — DEXAMETHASONE SODIUM PHOSPHATE 10 MG/ML IJ SOLN: 8.0000 mg | Freq: Once | INTRAMUSCULAR | Status: DC

## 2020-06-23 MED ORDER — FENTANYL CITRATE (PF) 100 MCG/2ML IJ SOLN: 25.0000 ug | INTRAMUSCULAR | Status: DC | PRN

## 2020-06-23 MED ORDER — SODIUM CHLORIDE (PF) 0.9 % IJ SOLN
INTRAMUSCULAR | Status: AC
  Filled 2020-06-23: qty 20

## 2020-06-23 MED ORDER — ALUM & MAG HYDROXIDE-SIMETH 200-200-20 MG/5ML PO SUSP: 30.0000 mL | ORAL | Status: DC | PRN

## 2020-06-23 MED ORDER — PROPOFOL 10 MG/ML IV BOLUS
INTRAVENOUS | Status: DC | PRN
  Administered 2020-06-23: 10 ug via INTRAVENOUS
  Administered 2020-06-23: 30 ug via INTRAVENOUS

## 2020-06-23 SURGICAL SUPPLY — 54 items
BAG ZIPLOCK 12X15 (MISCELLANEOUS) ×2 IMPLANT
BLADE SAGITTAL 13X1.27X60 (BLADE) ×2 IMPLANT
BLADE SAW SGTL 83.5X18.5 (BLADE) ×2 IMPLANT
BLADE SURG 15 STRL LF DISP TIS (BLADE) ×1 IMPLANT
BLADE SURG 15 STRL SS (BLADE) ×2
BLADE SURG SZ10 CARB STEEL (BLADE) ×4 IMPLANT
BNDG ELASTIC 6X5.8 VLCR STR LF (GAUZE/BANDAGES/DRESSINGS) ×2 IMPLANT
BOWL SMART MIX CTS (DISPOSABLE) ×2 IMPLANT
CEMENT BONE SIMPLEX SPEEDSET (Cement) ×4 IMPLANT
COVER SURGICAL LIGHT HANDLE (MISCELLANEOUS) ×2 IMPLANT
COVER WAND RF STERILE (DRAPES) IMPLANT
CUFF TOURN SGL QUICK 34 (TOURNIQUET CUFF) ×2
CUFF TRNQT CYL 34X4.125X (TOURNIQUET CUFF) ×1 IMPLANT
DECANTER SPIKE VIAL GLASS SM (MISCELLANEOUS) ×4 IMPLANT
DRAPE INCISE IOBAN 66X45 STRL (DRAPES) ×4 IMPLANT
DRAPE U-SHAPE 47X51 STRL (DRAPES) ×2 IMPLANT
DRSG AQUACEL AG ADV 3.5X10 (GAUZE/BANDAGES/DRESSINGS) ×2 IMPLANT
DURAPREP 26ML APPLICATOR (WOUND CARE) ×4 IMPLANT
ELECT REM PT RETURN 15FT ADLT (MISCELLANEOUS) ×2 IMPLANT
FEMUR  CMT CCR STD SZ11 R KNEE (Knees) ×2 IMPLANT
FEMUR CMT CCR STD SZ11 R KNEE (Knees) ×1 IMPLANT
FEMUR CMTD CCR STD SZ11 R KNEE (Knees) ×1 IMPLANT
GLOVE SURG ENC TEXT LTX SZ7.5 (GLOVE) ×2 IMPLANT
GLOVE SURG ORTHO LTX SZ8 (GLOVE) ×6 IMPLANT
GLOVE SURG UNDER POLY LF SZ7.5 (GLOVE) ×2 IMPLANT
GLOVE SURG UNDER POLY LF SZ8.5 (GLOVE) ×4 IMPLANT
GOWN STRL REUS W/ TWL XL LVL3 (GOWN DISPOSABLE) ×2 IMPLANT
GOWN STRL REUS W/TWL XL LVL3 (GOWN DISPOSABLE) ×4
HANDPIECE INTERPULSE COAX TIP (DISPOSABLE) ×2
HOLDER FOLEY CATH W/STRAP (MISCELLANEOUS) ×2 IMPLANT
HOOD PEEL AWAY FLYTE STAYCOOL (MISCELLANEOUS) ×6 IMPLANT
INSERT TIB PERSONA SZ12 RT (Insert) ×2 IMPLANT
KIT TURNOVER KIT A (KITS) ×2 IMPLANT
MANIFOLD NEPTUNE II (INSTRUMENTS) ×2 IMPLANT
NEEDLE HYPO 22GX1.5 SAFETY (NEEDLE) ×2 IMPLANT
NS IRRIG 1000ML POUR BTL (IV SOLUTION) ×2 IMPLANT
PACK TOTAL KNEE CUSTOM (KITS) ×2 IMPLANT
PENCIL SMOKE EVACUATOR (MISCELLANEOUS) ×2 IMPLANT
PROTECTOR NERVE ULNAR (MISCELLANEOUS) ×2 IMPLANT
SET HNDPC FAN SPRY TIP SCT (DISPOSABLE) ×1 IMPLANT
STEM POLY PAT PLY 35M KNEE (Knees) ×2 IMPLANT
STEM TIBIA 5 DEG SZ G R KNEE (Knees) ×1 IMPLANT
STRIP CLOSURE SKIN 1/2X4 (GAUZE/BANDAGES/DRESSINGS) ×4 IMPLANT
SUT BONE WAX W31G (SUTURE) ×2 IMPLANT
SUT MNCRL AB 3-0 PS2 18 (SUTURE) ×2 IMPLANT
SUT STRATAFIX 0 PDS 27 VIOLET (SUTURE) ×2
SUT STRATAFIX PDS+ 0 24IN (SUTURE) ×2 IMPLANT
SUT VIC AB 1 CT1 36 (SUTURE) ×2 IMPLANT
SUTURE STRATFX 0 PDS 27 VIOLET (SUTURE) ×1 IMPLANT
SYR 30ML LL (SYRINGE) ×4 IMPLANT
TIBIA STEM 5 DEG SZ G R KNEE (Knees) ×2 IMPLANT
TRAY FOLEY MTR SLVR 16FR STAT (SET/KITS/TRAYS/PACK) ×2 IMPLANT
WATER STERILE IRR 1000ML POUR (IV SOLUTION) ×4 IMPLANT
WRAP KNEE MAXI GEL POST OP (GAUZE/BANDAGES/DRESSINGS) ×2 IMPLANT

## 2020-06-23 NOTE — Anesthesia Procedure Notes (Signed)
Anesthesia Regional Block: Adductor canal block   Pre-Anesthetic Checklist: ,, timeout performed, Correct Patient, Correct Site, Correct Laterality, Correct Procedure, Correct Position, site marked, Risks and benefits discussed,  Surgical consent,  Pre-op evaluation,  At surgeon's request and post-op pain management  Laterality: Right  Prep: chloraprep       Needles:  Injection technique: Single-shot  Needle Type: Stimulator Needle - 80     Needle Length: 10cm  Needle Gauge: 21     Additional Needles:   Narrative:  Start time: 06/23/2020 8:04 AM End time: 06/23/2020 8:14 AM Injection made incrementally with aspirations every 5 mL.  Performed by: Personally

## 2020-06-23 NOTE — Op Note (Signed)
TOTAL KNEE REPLACEMENT OPERATIVE NOTE:  06/23/2020  11:10 AM  PATIENT:  Matthew Dickerson  65 y.o. male  PRE-OPERATIVE DIAGNOSIS:  Osteoarthritis of right knee M17.11  POST-OPERATIVE DIAGNOSIS:  Osteoarthritis of right knee M17.11  PROCEDURE:  Procedure(s): TOTAL KNEE ARTHROPLASTY  SURGEON:  Surgeon(s): Vickey Huger, MD  PHYSICIAN ASSISTANT: Carlyon Shadow, PA-C   ANESTHESIA:   spinal  SPECIMEN: None  COUNTS:  Correct  TOURNIQUET:   Total Tourniquet Time Documented: Thigh (Right) - 40 minutes Total: Thigh (Right) - 40 minutes   DICTATION:  Indication for procedure:    The patient is a 65 y.o. male who has failed conservative treatment for Osteoarthritis of right knee M17.11.  Informed consent was obtained prior to anesthesia. The risks versus benefits of the operation were explain and in a way the patient can, and did, understand.    Description of procedure:     The patient was taken to the operating room and placed under anesthesia.  The patient was positioned in the usual fashion taking care that all body parts were adequately padded and/or protected.  A tourniquet was applied and the leg prepped and draped in the usual sterile fashion.  The extremity was exsanguinated with the esmarch and tourniquet inflated to 350 mmHg.  Pre-operative range of motion was normal.    A midline incision approximately 6-7 inches long was made with a #10 blade.  A new blade was used to make a parapatellar arthrotomy going 2-3 cm into the quadriceps tendon, over the patella, and alongside the medial aspect of the patellar tendon.  A synovectomy was then performed with the #10 blade and forceps. I then elevated the deep MCL off the medial tibial metaphysis subperiosteally around to the semimembranosus attachment.    I everted the patella and used calipers to measure patellar thickness.  I used the reamer to ream down to appropriate thickness to recreate the native thickness.  I then removed  excess bone with the rongeur and sagittal saw.  I used the appropriately sized template and drilled the three lug holes.  I then put the trial in place and measured the thickness with the calipers to ensure recreation of the native thickness.  The trial was then removed and the patella subluxed and the knee brought into flexion.  A homan retractor was place to retract and protect the patella and lateral structures.  A Z-retractor was place medially to protect the medial structures.  The extra-medullary alignment system was used to make cut the tibial articular surface perpendicular to the anamotic axis of the tibia and in 3 degrees of posterior slope.  The cut surface and alignment jig was removed.  I then used the intramedullary alignment guide to make a valgus cut on the distal femur.  I then marked out the epicondylar axis on the distal femur.   I then used the anterior referencing sizer and measured the femur to be a size 11.  The 4-In-1 cutting block was screwed into place in external rotation matching the posterior condylar angle, making our cuts perpendicular to the epicondylar axis.  Anterior, posterior and chamfer cuts were made with the sagittal saw.  The cutting block and cut pieces were removed.  A lamina spreader was placed in 90 degrees of flexion.  The ACL, PCL, menisci, and posterior condylar osteophytes were removed.  A 12 mm spacer blocked was found to offer good flexion and extension gap balance after minimal in degree releasing.   The scoop retractor was then  placed and the femoral finishing block was pinned in place.  The small sagittal saw was used as well as the lug drill to finish the femur.  The block and cut surfaces were removed and the medullary canal hole filled with autograft bone from the cut pieces.  The tibia was delivered forward in deep flexion and external rotation.  A size G tray was selected and pinned into place centered on the medial 1/3 of the tibial tubercle.  The  reamer and keel was used to prepare the tibia through the tray.    I then trialed with the size 11 femur, size G tibia, a 12 mm insert and the 35 patella.  I had excellent flexion/extension gap balance, excellent patella tracking.  Flexion was full and beyond 120 degrees; extension was zero.  These components were chosen and the staff opened them to me on the back table while the knee was lavaged copiously and the cement mixed.  The soft tissue was infiltrated with 60cc of exparel 1.3% through a 21 gauge needle.  I cemented in the components and removed all excess cement.  The polyethylene tibial component was snapped into place and the knee placed in extension while cement was hardening.  The capsule was infilltrated with a 60cc exparel/marcaine/saline mixture.   Once the cement was hard, the tourniquet was let down.  Hemostasis was obtained.  The arthrotomy was closed using a #1 stratofix running suture.  The deep soft tissues were closed with #0 vicryls and the subcuticular layer closed with #2-0 vicryl.  The skin was reapproximated and closed with 3.0 Monocryl.  The wound was covered with steristrips, aquacel dressing, and a TED stocking.   The patient was then awakened, extubated, and taken to the recovery room in stable condition.  BLOOD LOSS:  062BJ COMPLICATIONS:  None.  PLAN OF CARE: Admit for overnight observation  PATIENT DISPOSITION:  PACU - hemodynamically stable.   Please fax a copy of this op note to my office at [phone] (please only include page 1 and 2 of the Case Information op note)

## 2020-06-23 NOTE — Progress Notes (Signed)
Assisted Dr. James Singer with Right Knee Adductor Canal  block. Side rails up, monitors on throughout procedure. See vital signs in flow sheet. Tolerated Procedure well.  
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
## 2020-06-23 NOTE — Anesthesia Postprocedure Evaluation (Signed)
Anesthesia Post Note  Patient: Matthew Dickerson  Procedure(s) Performed: TOTAL KNEE ARTHROPLASTY (Right Knee)     Patient location during evaluation: PACU Anesthesia Type: Spinal Level of consciousness: awake and alert Pain management: pain level controlled Vital Signs Assessment: post-procedure vital signs reviewed and stable Respiratory status: spontaneous breathing and respiratory function stable Cardiovascular status: blood pressure returned to baseline and stable Postop Assessment: spinal receding Anesthetic complications: no   No complications documented.  Last Vitals:  Vitals:   06/23/20 1215 06/23/20 1230  BP: 113/79 126/89  Pulse: (!) 44 61  Resp: 16 18  Temp:  36.7 C  SpO2: 95% 99%    Last Pain:  Vitals:   06/23/20 1215  TempSrc:   PainSc: 0-No pain                 Levenia Skalicky DANIEL

## 2020-06-23 NOTE — Transfer of Care (Signed)
Immediate Anesthesia Transfer of Care Note  Patient: Matthew Dickerson  Procedure(s) Performed: TOTAL KNEE ARTHROPLASTY (Right Knee)  Patient Location: PACU  Anesthesia Type:MAC and Spinal  Level of Consciousness: drowsy  Airway & Oxygen Therapy: Patient Spontanous Breathing and Patient connected to face mask oxygen  Post-op Assessment: Report given to RN and Post -op Vital signs reviewed and stable  Post vital signs: Reviewed and stable  Last Vitals:  Vitals Value Taken Time  BP 114/72 06/23/20 1053  Temp    Pulse 59 06/23/20 1054  Resp 18 06/23/20 1054  SpO2 99 % 06/23/20 1054  Vitals shown include unvalidated device data.  Last Pain:  Vitals:   06/23/20 0707  TempSrc: Oral         Complications: No complications documented.

## 2020-06-23 NOTE — Anesthesia Procedure Notes (Signed)
Procedure Name: MAC Date/Time: 06/23/2020 9:30 AM Performed by: Lieutenant Diego, CRNA Pre-anesthesia Checklist: Patient identified, Emergency Drugs available, Suction available, Patient being monitored and Timeout performed Patient Re-evaluated:Patient Re-evaluated prior to induction Oxygen Delivery Method: Simple face mask Preoxygenation: Pre-oxygenation with 100% oxygen Induction Type: IV induction

## 2020-06-23 NOTE — Evaluation (Signed)
Physical Therapy Evaluation Patient Details Name: Matthew Dickerson MRN: [id number] DOB: 02/25/1955 Today's Date: 06/23/2020   History of Present Illness  Patient is a 65 y.o. male s/p Rt TKA On 06/23/20 with PMH significant for PVD, aortic aneurysm, anginal pain, melanoma, OA, GERD, Lt TKA on 04/14/2020, Rt shoulder, and wrist surgery.    Clinical Impression  Matthew Dickerson is a 65 y.o. male POD 0 s/p Rt TKA. Patient reports independence with mobility at baseline. Patient is now limited by functional impairments (see PT problem list below) and requires min assist for transfers and gait with RW. Patient was able to ambulate ~40 feet with RW and min assist. Patient instructed in exercise to facilitate circulation to manage edema and reduce risk of DVT. Patient will benefit from continued skilled PT interventions to address impairments and progress towards PLOF. Acute PT will follow to progress mobility and stair training in preparation for safe discharge home.     Follow Up Recommendations Follow surgeon's recommendation for DC plan and follow-up therapies;Outpatient PT    Equipment Recommendations  None recommended by PT    Recommendations for Other Services       Precautions / Restrictions Precautions Precautions: Fall Restrictions Weight Bearing Restrictions: No Other Position/Activity Restrictions: WBAT      Mobility  Bed Mobility Overal bed mobility: Needs Assistance Bed Mobility: Supine to Sit     Supine to sit: HOB elevated;Min guard     General bed mobility comments: cues to use bed rail, pt taking extra time, guarding for safety.    Transfers Overall transfer level: Needs assistance Equipment used: Rolling walker (2 wheeled) Transfers: Sit to/from Stand Sit to Stand: Min assist         General transfer comment: cues for hand placement/technique with RW, assist to initiate and steady with rise  Ambulation/Gait Ambulation/Gait assistance: Min assist Gait Distance  (Feet): 40 Feet Assistive device: Rolling walker (2 wheeled) Gait Pattern/deviations: Step-to pattern;Decreased stride length;Decreased weight shift to right Gait velocity: decr   General Gait Details: cues for step to pattern and proximity to RW, no overt LOB and assist required for walker position  Stairs            Wheelchair Mobility    Modified Rankin (Stroke Patients Only)       Balance Overall balance assessment: Needs assistance Sitting-balance support: Feet supported Sitting balance-Leahy Scale: Good     Standing balance support: During functional activity;Bilateral upper extremity supported Standing balance-Leahy Scale: Poor                               Pertinent Vitals/Pain Pain Assessment: 0-10 Pain Score: 4  Pain Location: Rt TKA Pain Descriptors / Indicators: Aching;Dull;Discomfort Pain Intervention(s): Limited activity within patient's tolerance;Monitored during session;Repositioned    Home Living Family/patient expects to be discharged to:: Private residence Living Arrangements: Spouse/significant other;Children;Other relatives Available Help at Discharge: Family Type of Home: House Home Access: Stairs to enter Entrance Stairs-Rails: Can reach both Entrance Stairs-Number of Steps: 3 Home Layout: One level Home Equipment: [place] - 2 wheels;Cane - single point Additional Comments: pt lives with his wife who is available to assist and has children who are available intermittently.    Prior Function Level of Independence: Independent with assistive device(s)         Comments: use of cane and RW     Hand Dominance   Dominant Hand: Right    Extremity/Trunk Assessment  Upper Extremity Assessment Upper Extremity Assessment: Overall WFL for tasks assessed    Lower Extremity Assessment Lower Extremity Assessment: RLE deficits/detail RLE Deficits / Details: good quad activation, no extensor lag with SLR RLE Sensation:  WNL RLE Coordination: WNL    Cervical / Trunk Assessment Cervical / Trunk Assessment: Normal  Communication   Communication: No difficulties  Cognition Arousal/Alertness: Awake/alert Behavior During Therapy: WFL for tasks assessed/performed Overall Cognitive Status: Within Functional Limits for tasks assessed                                        General Comments      Exercises Total Joint Exercises Ankle Circles/Pumps: AROM;Both;20 reps;Seated   Assessment/Plan    PT Assessment Patient needs continued PT services  PT Problem List Decreased strength;Decreased range of motion;Decreased activity tolerance;Decreased balance;Decreased mobility;Decreased knowledge of use of DME;Decreased knowledge of precautions;Pain       PT Treatment Interventions DME instruction;Gait training;Stair training;Functional mobility training;Therapeutic activities;Therapeutic exercise;Balance training;Patient/family education    PT Goals (Current goals can be found in the Care Plan section)  Acute Rehab PT Goals Patient Stated Goal: 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 PT Goal Formulation: With patient Time For Goal Achievement: 06/30/20 Potential to Achieve Goals: Good    Frequency 7X/week   Barriers to discharge        Co-evaluation               AM-PAC PT "6 Clicks" Mobility  Outcome Measure Help needed turning from your back to your side while in a flat bed without using bedrails?: None Help needed moving from lying on your back to sitting on the side of a flat bed without using bedrails?: A Little Help needed moving to and from a bed to a chair (including a wheelchair)?: A Little Help needed standing up from a chair using your arms (e.g., wheelchair or bedside chair)?: A Little Help needed to walk in hospital room?: A Little Help needed climbing 3-5 steps with a railing? : A Little 6 Click Score: 19    End of Session Equipment Utilized During  Treatment: Gait belt Activity Tolerance: Patient tolerated treatment well Patient left: with call bell/phone within reach;in chair;with chair alarm set;with family/visitor present Nurse Communication: Mobility status PT Visit Diagnosis: Muscle weakness (generalized) (M62.81);Difficulty in walking, not elsewhere classified (R26.2)    Time: 6063-0160 PT Time Calculation (min) (ACUTE ONLY): 31 min   Charges:   PT Evaluation $PT Eval Low Complexity: 1 Low PT Treatments $Gait Training: 8-22 mins        Verner Mould, DPT Acute Rehabilitation Services Office [phone] Pager [phone]    Jacques Navy 06/23/2020, 4:53 PM

## 2020-06-23 NOTE — Progress Notes (Signed)
Orthopedic Tech Progress Note Patient Details:  Matthew Dickerson 1955-12-22 [id number] Pt already has Bone Foam from previous surgery.          [name] 06/23/2020, 6:16 PM

## 2020-06-23 NOTE — Anesthesia Procedure Notes (Signed)
Spinal  Patient location during procedure: OR Start time: 06/23/2020 9:26 AM End time: 06/23/2020 9:28 AM Staffing Performed: anesthesiologist  Preanesthetic Checklist Completed: patient identified, IV checked, site marked, risks and benefits discussed, surgical consent, monitors and equipment checked, pre-op evaluation and timeout performed Spinal Block Patient position: sitting Prep: DuraPrep Patient monitoring: heart rate, cardiac monitor, continuous pulse ox and blood pressure Approach: midline Location: L2-3 Injection technique: single-shot Needle Needle type: Pencan  Needle gauge: 24 G Needle length: 10 cm Assessment Sensory level: T4 Additional Notes Sterile prep and drape, including hand hygiene and sterile gloves were used. The patient was positioned and the spine was prepped. The skin was anesthetized with lidocaine.  Attempted by Eulas Post, CRNA unsuccessful. Tobias Alexander obtained free flow of clear CSF was obtained prior to injecting local anesthetic into the CSF.  The spinal needle aspirated freely following injection. The patient tolerated the procedure well. Pt supine immediately after SAB.

## 2020-06-24 ENCOUNTER — Encounter (HOSPITAL_COMMUNITY): Payer: Self-pay | Admitting: Orthopedic Surgery

## 2020-06-24 DIAGNOSIS — M1711 Unilateral primary osteoarthritis, right knee: Secondary | ICD-10-CM | POA: Diagnosis not present

## 2020-06-24 LAB — CBC
HCT: 38.8 % — ABNORMAL LOW (ref 39.0–52.0)
Hemoglobin: 12.8 g/dL — ABNORMAL LOW (ref 13.0–17.0)
MCH: 28.6 pg (ref 26.0–34.0)
MCHC: 33 g/dL (ref 30.0–36.0)
MCV: 86.8 fL (ref 80.0–100.0)
Platelets: 232 10*3/uL (ref 150–400)
RBC: 4.47 MIL/uL (ref 4.22–5.81)
RDW: 12.7 % (ref 11.5–15.5)
WBC: 12.5 10*3/uL — ABNORMAL HIGH (ref 4.0–10.5)
nRBC: 0 % (ref 0.0–0.2)

## 2020-06-24 LAB — BASIC METABOLIC PANEL
Anion gap: 8 (ref 5–15)
BUN: 13 mg/dL (ref 8–23)
CO2: 21 mmol/L — ABNORMAL LOW (ref 22–32)
Calcium: 9 mg/dL (ref 8.9–10.3)
Chloride: 109 mmol/L (ref 98–111)
Creatinine, Ser: 0.87 mg/dL (ref 0.61–1.24)
GFR, Estimated: >60 mL/min (ref >60)
Glucose, Bld: 129 mg/dL — ABNORMAL HIGH (ref 70–99)
Potassium: 3.8 mmol/L (ref 3.5–5.1)
Sodium: 138 mmol/L (ref 135–145)

## 2020-06-24 MED ORDER — TIZANIDINE HCL 4 MG PO TABS: 4.0000 mg | ORAL_TABLET | Freq: Four times a day (QID) | ORAL | 0 refills | Status: DC | PRN

## 2020-06-24 MED ORDER — OXYCODONE HCL 5 MG PO TABS: 5.0000 mg | ORAL_TABLET | ORAL | 0 refills | Status: DC | PRN

## 2020-06-24 MED ORDER — ASPIRIN 325 MG PO TBEC: 325.0000 mg | DELAYED_RELEASE_TABLET | Freq: Two times a day (BID) | ORAL | 0 refills | Status: DC

## 2020-06-24 MED ORDER — GABAPENTIN 300 MG PO CAPS: 300.0000 mg | ORAL_CAPSULE | Freq: Three times a day (TID) | ORAL | 0 refills | Status: DC

## 2020-06-24 NOTE — Plan of Care (Signed)

## 2020-06-24 NOTE — Progress Notes (Signed)
The patient is alert and oriented and has been seen by his physician. The orders for discharge were written. IV has been removed. Went over discharge instructions with patient and family. He was discharged via wheelchair with all of his belongings.

## 2020-06-24 NOTE — Progress Notes (Signed)
Physical Therapy Treatment Patient Details Name: Matthew Dickerson MRN: [id number] DOB: 1955/12/15 Today's Date: 06/24/2020    History of Present Illness Patient is a 65 y.o. male s/p Rt TKA On 06/23/20 with PMH significant for PVD, aortic aneurysm, anginal pain, melanoma, OA, GERD, Lt TKA on 04/14/2020, Rt shoulder, and wrist surgery.    PT Comments    Progressing with mobility. Pt is eager to d/c home. Reviewed gait and stair training. Pt stated he was sore from performing his exercises just prior to my arrival. All education completed. Okay to d/c from PT standpoint.    Follow Up Recommendations  Follow surgeon's recommendation for DC plan and follow-up therapies     Equipment Recommendations  None recommended by PT    Recommendations for Other Services       Precautions / Restrictions Precautions Precautions: Fall Restrictions Weight Bearing Restrictions: No Other Position/Activity Restrictions: WBAT    Mobility  Bed Mobility Overal bed mobility: Modified Independent                  Transfers Overall transfer level: Needs assistance Equipment used: Rolling walker (2 wheeled) Transfers: Sit to/from Stand Sit to Stand: Supervision         General transfer comment: for safety  Ambulation/Gait Ambulation/Gait assistance: Supervision Gait Distance (Feet): 125 Feet Assistive device: Rolling walker (2 wheeled) Gait Pattern/deviations: Step-through pattern;Decreased stride length     General Gait Details: for safety   Stairs             Wheelchair Mobility    Modified Rankin (Stroke Patients Only)       Balance Overall balance assessment: Needs assistance         Standing balance support: Bilateral upper extremity supported Standing balance-Leahy Scale: Poor                              Cognition Arousal/Alertness: Awake/alert Behavior During Therapy: WFL for tasks assessed/performed Overall Cognitive Status: Within  Functional Limits for tasks assessed                                        Exercises      General Comments        Pertinent Vitals/Pain Pain Assessment: 0-10 Pain Score: 7  Pain Location: R knee Pain Descriptors / Indicators: Discomfort;Sore Pain Intervention(s): Limited activity within patient's tolerance;Monitored during session    Home Living                      Prior Function            PT Goals (current goals can now be found in the care plan section) Progress towards PT goals: Progressing toward goals    Frequency    7X/week      PT Plan Current plan remains appropriate    Co-evaluation              AM-PAC PT "6 Clicks" Mobility   Outcome Measure  Help needed turning from your back to your side while in a flat bed without using bedrails?: None Help needed moving from lying on your back to sitting on the side of a flat bed without using bedrails?: None Help needed moving to and from a bed to a chair (including a wheelchair)?: A Little Help needed standing up from a  chair using your arms (e.g., wheelchair or bedside chair)?: A Little Help needed to walk in hospital room?: A Little Help needed climbing 3-5 steps with a railing? : A Little 6 Click Score: 20    End of Session Equipment Utilized During Treatment: Gait belt Activity Tolerance: Patient tolerated treatment well Patient left: with call bell/phone within reach;with family/visitor present   PT Visit Diagnosis: Pain;Other abnormalities of gait and mobility (R26.89) Pain - Right/Left: Right Pain - part of body: Knee     Time: 2800-3491 PT Time Calculation (min) (ACUTE ONLY): 18 min  Charges:  $Gait Training: 8-22 mins                         Doreatha Massed, PT Acute Rehabilitation  Office: [phone] Pager: [phone]

## 2020-06-24 NOTE — Progress Notes (Signed)
SPORTS MEDICINE AND JOINT REPLACEMENT  Lara Mulch, MD    Carlyon Shadow, PA-C [address], [place], [place]  [address]                             [phone]   PROGRESS NOTE  Subjective:  negative for Chest Pain  negative for Shortness of Breath  negative for Nausea/Vomiting   negative for Calf Pain  negative for Bowel Movement   Tolerating Diet: yes         Patient reports pain as 3 on 0-10 scale.    Objective: Vital signs in last 24 hours:   Patient Vitals for the past 24 hrs:  BP Temp Temp src Pulse Resp SpO2  06/24/20 0521 (!) 162/92 98.5 F (36.9 C) Oral 71 16 97 %  06/24/20 0132 (!) 150/87 98.4 F (36.9 C) Oral 71 17 96 %  06/23/20 2125 (!) 149/84 98 F (36.7 C) Axillary 80 16 98 %  06/23/20 1845 (!) 155/81 98.9 F (37.2 C) Oral 80 18 97 %  06/23/20 1540 (!) 169/99 98 F (36.7 C) -- 61 -- 96 %  06/23/20 1310 (!) 140/97 -- -- (!) 58 16 100 %  06/23/20 1245 137/89 98 F (36.7 C) -- (!) 57 20 100 %  06/23/20 1230 126/89 98 F (36.7 C) -- 61 18 99 %    @flow {1959:LAST@   Intake/Output from previous day:   05/02 0701 - 05/03 0700 In: 2373.5 [P.O.:360; I.V.:1813.5] Out: 2900 [Urine:2850]   Intake/Output this shift:   05/03 0701 - 05/03 1900 In: 60 [P.O.:60] Out: 125 [Urine:125]   Intake/Output      05/02 0701 05/03 0700 05/03 0701 05/04 0700   P.O. 360 60   I.V. 1813.5 0   IV Piggyback 200    Total Intake 2373.5 60   Urine 2850 125   Blood 50    Total Output 2900 125   Net -526.5 -65           LABORATORY DATA: Recent Labs    06/19/20 1051 06/24/20 0310  WBC 8.9 12.5*  HGB 16.0 12.8*  HCT 48.1 38.8*  PLT 286 232   Recent Labs    06/19/20 1051 06/24/20 0310  NA 137 138  K 3.8 3.8  CL 105 109  CO2 25 21*  BUN 14 13  CREATININE 0.97 0.87  GLUCOSE 112* 129*  CALCIUM 9.3 9.0   No results found for: INR, PROTIME  Examination:  General appearance: alert, cooperative and no distress Extremities: extremities normal,  atraumatic, no cyanosis or edema  Wound Exam: clean, dry, intact   Drainage:  None: wound tissue dry  Motor Exam: Quadriceps and Hamstrings Intact  Sensory Exam: Deep Peroneal and Tibial normal   Assessment:    1 Day Post-Op  Procedure(s) (LRB): TOTAL KNEE ARTHROPLASTY (Right)  ADDITIONAL DIAGNOSIS:  Active Problems:   * No active hospital problems. *     Plan: Physical Therapy as ordered Weight Bearing as Tolerated (WBAT)  DVT Prophylaxis:  Aspirin  DISCHARGE PLAN: Home  DISCHARGE NEEDS: HHPT     Patient doing well and ready for discharge   Patient's anticipated LOS is less than 2 midnights, meeting these requirements: - Younger than 34 - Lives within 1 hour of care - Has a competent adult at home to recover with post-op recover - NO history of  - Chronic pain requiring opiods  - Diabetes  - Coronary Artery  Disease  - Heart failure  - Heart attack  - Stroke  - DVT/VTE  - Cardiac arrhythmia  - Respiratory Failure/COPD  - Renal failure  - Anemia  - Advanced Liver disease        Donia Ast 06/24/2020, 12:29 PM
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
## 2020-06-24 NOTE — Plan of Care (Signed)
  Problem: Education: Goal: Knowledge of General Education information will improve Description Including pain rating scale, medication(s)/side effects and non-pharmacologic comfort measures Outcome: Progressing   

## 2020-06-24 NOTE — TOC Transition Note (Signed)
Transition of Care [facility]) - CM/SW Discharge Note  Patient Details  Name: DACOTAH CABELLO MRN: [id number] Date of Birth: 1956-02-10  Transition of Care [facility] CM/SW Contact:  Sherie Don, LCSW Phone Number: 06/24/2020, 12:13 PM  Clinical Narrative: Patient expected to discharge home today after working with PT. CSW met with patient and his wife to review discharge plan. Per patient, he will go to OPPT through [facility]. Patient recently had another knee surgery, so he has a rolling walker, 3N1, and cane at home. [employer] has also provided the CPM. No needs identified at this time. TOC signing off.  Final next level of care: OP Rehab Barriers to Discharge: No Barriers Identified  Patient Goals and CMS Choice Patient states their goals for this hospitalization and ongoing recovery are:: Discharge home with OPPT through [facility] Medicare.gov Compare Post Acute Care list provided to:: Patient Choice offered to / list presented to : Patient  Discharge Plan and Services        DME Arranged: CPM DME Agency: [employer] Representative spoke with at DME Agency: Pre-arranged in orthopedist's office  Readmission Risk Interventions No flowsheet data found.

## 2020-11-11 ENCOUNTER — Other Ambulatory Visit: Payer: Self-pay

## 2020-11-11 ENCOUNTER — Encounter (HOSPITAL_COMMUNITY): Payer: Self-pay | Admitting: Emergency Medicine

## 2020-11-11 ENCOUNTER — Emergency Department (HOSPITAL_COMMUNITY): Payer: Medicare Other

## 2020-11-11 ENCOUNTER — Inpatient Hospital Stay (HOSPITAL_COMMUNITY)
Admission: EM | Admit: 2020-11-11 | Discharge: 2020-11-14 | DRG: 247 | Disposition: A | Discharge status: Home / Self Care | Payer: Medicare Other | Attending: Internal Medicine | Admitting: Internal Medicine

## 2020-11-11 DIAGNOSIS — I214 Non-ST elevation (NSTEMI) myocardial infarction: Secondary | ICD-10-CM | POA: Diagnosis not present

## 2020-11-11 DIAGNOSIS — I452 Bifascicular block: Secondary | ICD-10-CM | POA: Diagnosis present

## 2020-11-11 DIAGNOSIS — R778 Other specified abnormalities of plasma proteins: Secondary | ICD-10-CM | POA: Diagnosis present

## 2020-11-11 DIAGNOSIS — G8929 Other chronic pain: Secondary | ICD-10-CM | POA: Diagnosis present

## 2020-11-11 DIAGNOSIS — Z8249 Family history of ischemic heart disease and other diseases of the circulatory system: Secondary | ICD-10-CM

## 2020-11-11 DIAGNOSIS — Z85820 Personal history of malignant melanoma of skin: Secondary | ICD-10-CM

## 2020-11-11 DIAGNOSIS — R079 Chest pain, unspecified: Principal | ICD-10-CM

## 2020-11-11 DIAGNOSIS — Z96653 Presence of artificial knee joint, bilateral: Secondary | ICD-10-CM | POA: Diagnosis present

## 2020-11-11 DIAGNOSIS — Z20822 Contact with and (suspected) exposure to covid-19: Secondary | ICD-10-CM | POA: Diagnosis present

## 2020-11-11 DIAGNOSIS — F1490 Cocaine use, unspecified, uncomplicated: Secondary | ICD-10-CM | POA: Diagnosis present

## 2020-11-11 DIAGNOSIS — M19041 Primary osteoarthritis, right hand: Secondary | ICD-10-CM | POA: Diagnosis present

## 2020-11-11 DIAGNOSIS — R001 Bradycardia, unspecified: Secondary | ICD-10-CM | POA: Diagnosis present

## 2020-11-11 DIAGNOSIS — F17210 Nicotine dependence, cigarettes, uncomplicated: Secondary | ICD-10-CM | POA: Diagnosis present

## 2020-11-11 DIAGNOSIS — I25110 Atherosclerotic heart disease of native coronary artery with unstable angina pectoris: Secondary | ICD-10-CM | POA: Diagnosis present

## 2020-11-11 DIAGNOSIS — I7121 Aneurysm of the ascending aorta, without rupture: Secondary | ICD-10-CM | POA: Diagnosis present

## 2020-11-11 DIAGNOSIS — I10 Essential (primary) hypertension: Secondary | ICD-10-CM | POA: Diagnosis present

## 2020-11-11 DIAGNOSIS — Z955 Presence of coronary angioplasty implant and graft: Secondary | ICD-10-CM

## 2020-11-11 DIAGNOSIS — I712 Thoracic aortic aneurysm, without rupture: Secondary | ICD-10-CM | POA: Diagnosis present

## 2020-11-11 DIAGNOSIS — Z86718 Personal history of other venous thrombosis and embolism: Secondary | ICD-10-CM

## 2020-11-11 DIAGNOSIS — Z7982 Long term (current) use of aspirin: Secondary | ICD-10-CM

## 2020-11-11 DIAGNOSIS — Z23 Encounter for immunization: Secondary | ICD-10-CM

## 2020-11-11 DIAGNOSIS — F1290 Cannabis use, unspecified, uncomplicated: Secondary | ICD-10-CM | POA: Diagnosis present

## 2020-11-11 DIAGNOSIS — K219 Gastro-esophageal reflux disease without esophagitis: Secondary | ICD-10-CM | POA: Diagnosis present

## 2020-11-11 DIAGNOSIS — E876 Hypokalemia: Secondary | ICD-10-CM | POA: Diagnosis present

## 2020-11-11 DIAGNOSIS — M19042 Primary osteoarthritis, left hand: Secondary | ICD-10-CM | POA: Diagnosis present

## 2020-11-11 DIAGNOSIS — Z79899 Other long term (current) drug therapy: Secondary | ICD-10-CM

## 2020-11-11 LAB — CBC
HCT: 43.6 % (ref 39.0–52.0)
Hemoglobin: 14.7 g/dL (ref 13.0–17.0)
MCH: 28.7 pg (ref 26.0–34.0)
MCHC: 33.7 g/dL (ref 30.0–36.0)
MCV: 85 fL (ref 80.0–100.0)
Platelets: 261 10*3/uL (ref 150–400)
RBC: 5.13 MIL/uL (ref 4.22–5.81)
RDW: 13.5 % (ref 11.5–15.5)
WBC: 11.8 10*3/uL — ABNORMAL HIGH (ref 4.0–10.5)
nRBC: 0 % (ref 0.0–0.2)

## 2020-11-11 LAB — COMPREHENSIVE METABOLIC PANEL
ALT: 11 U/L (ref 0–44)
AST: 13 U/L — ABNORMAL LOW (ref 15–41)
Albumin: 3.8 g/dL (ref 3.5–5.0)
Alkaline Phosphatase: 95 U/L (ref 38–126)
Anion gap: 5 (ref 5–15)
BUN: 10 mg/dL (ref 8–23)
CO2: 22 mmol/L (ref 22–32)
Calcium: 8.7 mg/dL — ABNORMAL LOW (ref 8.9–10.3)
Chloride: 107 mmol/L (ref 98–111)
Creatinine, Ser: 1.02 mg/dL (ref 0.61–1.24)
GFR, Estimated: >60 mL/min (ref >60)
Glucose, Bld: 119 mg/dL — ABNORMAL HIGH (ref 70–99)
Potassium: 3.4 mmol/L — ABNORMAL LOW (ref 3.5–5.1)
Sodium: 134 mmol/L — ABNORMAL LOW (ref 135–145)
Total Bilirubin: 0.8 mg/dL (ref 0.3–1.2)
Total Protein: 7.4 g/dL (ref 6.5–8.1)

## 2020-11-11 LAB — TROPONIN I (HIGH SENSITIVITY): Troponin I (High Sensitivity): 66 ng/L — ABNORMAL HIGH (ref <18)
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
## 2020-11-12 ENCOUNTER — Observation Stay (HOSPITAL_BASED_OUTPATIENT_CLINIC_OR_DEPARTMENT_OTHER): Payer: Medicare Other

## 2020-11-12 ENCOUNTER — Encounter (HOSPITAL_COMMUNITY): Payer: Self-pay | Admitting: Family Medicine

## 2020-11-12 DIAGNOSIS — I10 Essential (primary) hypertension: Secondary | ICD-10-CM

## 2020-11-12 DIAGNOSIS — R778 Other specified abnormalities of plasma proteins: Secondary | ICD-10-CM

## 2020-11-12 DIAGNOSIS — R079 Chest pain, unspecified: Secondary | ICD-10-CM

## 2020-11-12 DIAGNOSIS — E876 Hypokalemia: Secondary | ICD-10-CM | POA: Diagnosis not present

## 2020-11-12 DIAGNOSIS — I214 Non-ST elevation (NSTEMI) myocardial infarction: Principal | ICD-10-CM

## 2020-11-12 DIAGNOSIS — I712 Thoracic aortic aneurysm, without rupture: Secondary | ICD-10-CM | POA: Diagnosis not present

## 2020-11-12 LAB — COMPREHENSIVE METABOLIC PANEL
ALT: 9 U/L (ref 0–44)
AST: 14 U/L — ABNORMAL LOW (ref 15–41)
Albumin: 3.5 g/dL (ref 3.5–5.0)
Alkaline Phosphatase: 93 U/L (ref 38–126)
Anion gap: 6 (ref 5–15)
BUN: 11 mg/dL (ref 8–23)
CO2: 24 mmol/L (ref 22–32)
Calcium: 8.5 mg/dL — ABNORMAL LOW (ref 8.9–10.3)
Chloride: 104 mmol/L (ref 98–111)
Creatinine, Ser: 0.92 mg/dL (ref 0.61–1.24)
GFR, Estimated: >60 mL/min (ref >60)
Glucose, Bld: 149 mg/dL — ABNORMAL HIGH (ref 70–99)
Potassium: 3.4 mmol/L — ABNORMAL LOW (ref 3.5–5.1)
Sodium: 134 mmol/L — ABNORMAL LOW (ref 135–145)
Total Bilirubin: 0.6 mg/dL (ref 0.3–1.2)
Total Protein: 6.9 g/dL (ref 6.5–8.1)

## 2020-11-12 LAB — LIPID PANEL
Cholesterol: 106 mg/dL (ref 0–200)
HDL: 24 mg/dL — ABNORMAL LOW (ref >40)
LDL Cholesterol: 74 mg/dL (ref 0–99)
Total CHOL/HDL Ratio: 4.4 RATIO
Triglycerides: 39 mg/dL (ref <150)
VLDL: 8 mg/dL (ref 0–40)

## 2020-11-12 LAB — CBC
HCT: 41.9 % (ref 39.0–52.0)
Hemoglobin: 14.2 g/dL (ref 13.0–17.0)
MCH: 28.9 pg (ref 26.0–34.0)
MCHC: 33.9 g/dL (ref 30.0–36.0)
MCV: 85.2 fL (ref 80.0–100.0)
Platelets: 279 10*3/uL (ref 150–400)
RBC: 4.92 MIL/uL (ref 4.22–5.81)
RDW: 13.8 % (ref 11.5–15.5)
WBC: 9.5 10*3/uL (ref 4.0–10.5)
nRBC: 0 % (ref 0.0–0.2)

## 2020-11-12 LAB — TROPONIN I (HIGH SENSITIVITY)
Troponin I (High Sensitivity): 173 ng/L (ref <18)
Troponin I (High Sensitivity): 282 ng/L (ref <18)
Troponin I (High Sensitivity): 467 ng/L (ref <18)
Troponin I (High Sensitivity): 705 ng/L (ref <18)
Troponin I (High Sensitivity): 801 ng/L (ref <18)
Troponin I (High Sensitivity): 730 ng/L (ref <18)

## 2020-11-12 LAB — TSH: TSH: 2.207 u[IU]/mL (ref 0.350–4.500)

## 2020-11-12 LAB — ECHOCARDIOGRAM COMPLETE
AR max vel: 1.77 cm2
AV Area VTI: 1.77 cm2
AV Area mean vel: 1.58 cm2
AV Mean grad: 5 mmHg
AV Peak grad: 7.8 mmHg
Ao pk vel: 1.4 m/s
Area-P 1/2: 3.31 cm2
Calc EF: 42.9 %
Height: 73 in
MV VTI: 2.04 cm2
S' Lateral: 4.1 cm
Single Plane A2C EF: 39.3 %
Single Plane A4C EF: 44.6 %
Weight: 3936 oz

## 2020-11-12 LAB — RAPID URINE DRUG SCREEN, HOSP PERFORMED
Amphetamines: NOT DETECTED
Barbiturates: NOT DETECTED
Benzodiazepines: NOT DETECTED
Cocaine: POSITIVE — AB
Opiates: NOT DETECTED
Tetrahydrocannabinol: POSITIVE — AB

## 2020-11-12 LAB — HIV ANTIBODY (ROUTINE TESTING W REFLEX): HIV Screen 4th Generation wRfx: NONREACTIVE

## 2020-11-12 LAB — RESP PANEL BY RT-PCR (FLU A&B, COVID) ARPGX2
Influenza A by PCR: NEGATIVE
Influenza B by PCR: NEGATIVE
SARS Coronavirus 2 by RT PCR: NEGATIVE

## 2020-11-12 LAB — MAGNESIUM: Magnesium: 2.1 mg/dL (ref 1.7–2.4)

## 2020-11-12 MED ORDER — ACETAMINOPHEN 325 MG PO TABS: 650.0000 mg | ORAL_TABLET | ORAL | Status: DC | PRN

## 2020-11-12 MED ORDER — ATORVASTATIN CALCIUM 40 MG PO TABS
40.0000 mg | ORAL_TABLET | Freq: Every day | ORAL | Status: DC
  Administered 2020-11-12: 40 mg via ORAL
  Filled 2020-11-12: qty 1

## 2020-11-12 MED ORDER — ENOXAPARIN SODIUM 120 MG/0.8ML IJ SOSY
110.0000 mg | PREFILLED_SYRINGE | Freq: Two times a day (BID) | INTRAMUSCULAR | Status: DC
  Administered 2020-11-12 – 2020-11-13 (×2): 110 mg via SUBCUTANEOUS
  Filled 2020-11-12: qty 0.74
  Filled 2020-11-12: qty 0.8
  Filled 2020-11-12 (×2): qty 0.74

## 2020-11-12 MED ORDER — ASPIRIN 81 MG PO CHEW
81.0000 mg | CHEWABLE_TABLET | ORAL | Status: AC
  Administered 2020-11-13: 81 mg via ORAL
  Filled 2020-11-12: qty 1

## 2020-11-12 MED ORDER — ENOXAPARIN SODIUM 120 MG/0.8ML IJ SOSY
1.0000 mg/kg | PREFILLED_SYRINGE | Freq: Two times a day (BID) | INTRAMUSCULAR | Status: DC
  Administered 2020-11-12: 111 mg via SUBCUTANEOUS
  Filled 2020-11-12: qty 0.8

## 2020-11-12 MED ORDER — SODIUM CHLORIDE 0.9% FLUSH
3.0000 mL | Freq: Two times a day (BID) | INTRAVENOUS | Status: DC
  Administered 2020-11-13: 3 mL via INTRAVENOUS

## 2020-11-12 MED ORDER — SODIUM CHLORIDE 0.9 % IV SOLN: 250.0000 mL | INTRAVENOUS | Status: DC | PRN

## 2020-11-12 MED ORDER — HYDRALAZINE HCL 20 MG/ML IJ SOLN: 5.0000 mg | Freq: Four times a day (QID) | INTRAMUSCULAR | Status: DC | PRN

## 2020-11-12 MED ORDER — IPRATROPIUM-ALBUTEROL 0.5-2.5 (3) MG/3ML IN SOLN: 3.0000 mL | RESPIRATORY_TRACT | Status: DC | PRN

## 2020-11-12 MED ORDER — ASPIRIN EC 325 MG PO TBEC
325.0000 mg | DELAYED_RELEASE_TABLET | Freq: Two times a day (BID) | ORAL | Status: DC
  Administered 2020-11-12: 325 mg via ORAL
  Filled 2020-11-12: qty 1

## 2020-11-12 MED ORDER — SODIUM CHLORIDE 0.9 % WEIGHT BASED INFUSION
3.0000 mL/kg/h | INTRAVENOUS | Status: DC
Start: 2020-11-13 — End: 2020-11-13
  Administered 2020-11-13: 3 mL/kg/h via INTRAVENOUS

## 2020-11-12 MED ORDER — HEPARIN SODIUM (PORCINE) 5000 UNIT/ML IJ SOLN: 5000.0000 [IU] | Freq: Three times a day (TID) | INTRAMUSCULAR | Status: DC

## 2020-11-12 MED ORDER — POTASSIUM CHLORIDE 20 MEQ PO PACK
20.0000 meq | PACK | Freq: Once | ORAL | Status: AC
  Administered 2020-11-12: 20 meq via ORAL
  Filled 2020-11-12: qty 1

## 2020-11-12 MED ORDER — ASPIRIN EC 325 MG PO TBEC: 325.0000 mg | DELAYED_RELEASE_TABLET | Freq: Every day | ORAL | Status: DC

## 2020-11-12 MED ORDER — SENNOSIDES-DOCUSATE SODIUM 8.6-50 MG PO TABS: 1.0000 | ORAL_TABLET | Freq: Every evening | ORAL | Status: DC | PRN

## 2020-11-12 MED ORDER — AMLODIPINE BESYLATE 5 MG PO TABS
5.0000 mg | ORAL_TABLET | Freq: Every day | ORAL | Status: DC
  Administered 2020-11-12 – 2020-11-14 (×3): 5 mg via ORAL
  Filled 2020-11-12 (×3): qty 1

## 2020-11-12 MED ORDER — SODIUM CHLORIDE 0.9% FLUSH: 3.0000 mL | INTRAVENOUS | Status: DC | PRN

## 2020-11-12 MED ORDER — IPRATROPIUM-ALBUTEROL 0.5-2.5 (3) MG/3ML IN SOLN
3.0000 mL | Freq: Four times a day (QID) | RESPIRATORY_TRACT | Status: DC
  Administered 2020-11-12: 3 mL via RESPIRATORY_TRACT
  Filled 2020-11-12: qty 3

## 2020-11-12 MED ORDER — GABAPENTIN 300 MG PO CAPS
300.0000 mg | ORAL_CAPSULE | Freq: Three times a day (TID) | ORAL | Status: DC
  Administered 2020-11-12 – 2020-11-14 (×7): 300 mg via ORAL
  Filled 2020-11-12 (×7): qty 1

## 2020-11-12 MED ORDER — ACETAMINOPHEN 325 MG PO TABS: 650.0000 mg | ORAL_TABLET | Freq: Four times a day (QID) | ORAL | Status: DC | PRN

## 2020-11-12 MED ORDER — ALUM & MAG HYDROXIDE-SIMETH 200-200-20 MG/5ML PO SUSP
30.0000 mL | Freq: Once | ORAL | Status: AC
  Administered 2020-11-12: 30 mL via ORAL
  Filled 2020-11-12: qty 30

## 2020-11-12 MED ORDER — OXYCODONE HCL 5 MG PO TABS
5.0000 mg | ORAL_TABLET | ORAL | Status: DC | PRN
Start: 2020-11-12 — End: 2020-11-14

## 2020-11-12 MED ORDER — SODIUM CHLORIDE 0.9 % WEIGHT BASED INFUSION
1.0000 mL/kg/h | INTRAVENOUS | Status: DC
  Administered 2020-11-13: 1 mL/kg/h via INTRAVENOUS

## 2020-11-12 MED ORDER — POTASSIUM CHLORIDE 10 MEQ/100ML IV SOLN
10.0000 meq | INTRAVENOUS | Status: AC
  Administered 2020-11-12 (×4): 10 meq via INTRAVENOUS
  Filled 2020-11-12 (×4): qty 100

## 2020-11-12 MED ORDER — ASPIRIN EC 81 MG PO TBEC
81.0000 mg | DELAYED_RELEASE_TABLET | Freq: Every day | ORAL | Status: DC
  Administered 2020-11-14: 81 mg via ORAL
  Filled 2020-11-12: qty 1

## 2020-11-12 MED ORDER — PANTOPRAZOLE SODIUM 40 MG IV SOLR
40.0000 mg | INTRAVENOUS | Status: DC
  Administered 2020-11-12 – 2020-11-13 (×2): 40 mg via INTRAVENOUS
  Filled 2020-11-12 (×2): qty 40

## 2020-11-12 MED ORDER — ONDANSETRON HCL 4 MG/2ML IJ SOLN: 4.0000 mg | Freq: Four times a day (QID) | INTRAMUSCULAR | Status: DC | PRN

## 2020-11-12 NOTE — Progress Notes (Signed)
PROGRESS NOTE    Matthew Dickerson  IRW:[id number] DOB: 1955-03-12 DOA: 11/11/2020 PCP: Jake Samples, PA-C   Brief Narrative:  65 year old with history of anginal pain, thoracic aortic aneurysm, GERD, PVD comes to the hospital with complaints of chest pain.  Upon admission EKG was in normal sinus rhythm with RBBB.  Chest x-ray showed central bronchial thickening.  Cardiology team consulted and recommended transferring patient to [facility] for left heart catheterization.   Assessment & Plan:   Active Problems:   Ascending aortic aneurysm (H[facility])   Chest pain   Elevated troponin   Hypokalemia  Anginal chest pain with elevated troponin; NSTEMI -Troponin continues to increase.  Currently on Lovenox 1 mg/kg. Trop 66 > 179 > 282 > 467 > 801.  Transfer patient to [facility] for left heart catheterization. - Echocardiogram ordered-results are pending - Cardiology consulted -UDS positive for cocaine; THC -LDL 74; check A1c  Hypokalemia -Replete as needed  Tobacco use with bronchial thickening - As needed and scheduled bronchodilators  Chronic pain - Gabapentin  GERD - PPI  Ascending thoracic aneurysm -Last seen by CT surgery January 2021, recommended blood pressure control and follow-up in 2 year for repeat CTA chest.  He will need to follow-up upon discharge in next 3 months     DVT prophylaxis: Lovenox Code Status: Full Family Communication:     Dispo: The patient is from: Home              Anticipated d/c is to: Home              Patient currently is not medically stable to d/c.   Difficult to place patient No      Subjective: At first patient was hesitant to go to [facility] for left heart catheterization but after lengthy discussion he has agreed.  He does admit to quite a lot of stress at home with a lot of family issues.  Currently he is chest pain-free and does not have any complaints.  Review of Systems Otherwise negative  except as per HPI, including: General: Denies fever, chills, night sweats or unintended weight loss. Resp: Denies cough, wheezing, shortness of breath. Cardiac: Denies chest pain, palpitations, orthopnea, paroxysmal nocturnal dyspnea. GI: Denies abdominal pain, nausea, vomiting, diarrhea or constipation GU: Denies dysuria, frequency, hesitancy or incontinence MS: Denies muscle aches, joint pain or swelling Neuro: Denies headache, neurologic deficits (focal weakness, numbness, tingling), abnormal gait Psych: Denies anxiety, depression, SI/HI/AVH Skin: Denies new rashes or lesions ID: Denies sick contacts, exotic exposures, travel  Examination:  General exam: Appears calm and comfortable  Respiratory system: Minimal bilateral rhonchi Cardiovascular system: S1 & S2 heard, RRR. No JVD, murmurs, rubs, gallops or clicks. No pedal edema. Gastrointestinal system: Abdomen is nondistended, soft and nontender. No organomegaly or masses felt. Normal bowel sounds heard. Central nervous system: Alert and oriented. No focal neurological deficits. Extremities: Symmetric 5 x 5 power. Skin: No rashes, lesions or ulcers Psychiatry: Judgement and insight appear normal. Mood & affect appropriate.     Objective: Vitals:   11/12/20 0000 11/12/20 0108 11/12/20 0119 11/12/20 0507  BP: (!) 162/97 (!) 149/89  (!) 156/93  Pulse: 79 (!) 44  (!) 58  Resp: 19 20  18   Temp:  98 F (36.7 C)  98.2 F (36.8 C)  TempSrc:  Oral    SpO2: 97% 91%  96%  Weight:   111.6 kg   Height:   6\' 1"  (1.854 m)  No intake or output data in the 24 hours ending 11/12/20 0750 Filed Weights   11/11/20 2121 11/12/20 0119  Weight: 105 kg 111.6 kg     Data Reviewed:   CBC: Recent Labs  Lab 11/11/20 2221 11/12/20 0246  WBC 11.8* 9.5  HGB 14.7 14.2  HCT 43.6 41.9  MCV 85.0 85.2  PLT 261 242   Basic Metabolic Panel: Recent Labs  Lab 11/11/20 2221 11/12/20 0246  NA 134* 134*  K 3.4* 3.4*  CL 107 104  CO2 22 24   GLUCOSE 119* 149*  BUN 10 11  CREATININE 1.02 0.92  CALCIUM 8.7* 8.5*  MG  --  2.1   GFR: Estimated Creatinine Clearance: 104.8 mL/min (by C-G formula based on SCr of 0.92 mg/dL). Liver Function Tests: Recent Labs  Lab 11/11/20 2221 11/12/20 0246  AST 13* 14*  ALT 11 9  ALKPHOS 95 93  BILITOT 0.8 0.6  PROT 7.4 6.9  ALBUMIN 3.8 3.5   No results for input(s): LIPASE, AMYLASE in the last 168 hours. No results for input(s): AMMONIA in the last 168 hours. Coagulation Profile: No results for input(s): INR, PROTIME in the last 168 hours. Cardiac Enzymes: No results for input(s): CKTOTAL, CKMB, CKMBINDEX, TROPONINI in the last 168 hours. BNP (last 3 results) No results for input(s): PROBNP in the last 8760 hours. HbA1C: No results for input(s): HGBA1C in the last 72 hours. CBG: No results for input(s): GLUCAP in the last 168 hours. Lipid Profile: Recent Labs    11/12/20 0246  CHOL 106  HDL 24*  LDLCALC 74  TRIG 39  CHOLHDL 4.4   Thyroid Function Tests: No results for input(s): TSH, T4TOTAL, FREET4, T3FREE, THYROIDAB in the last 72 hours. Anemia Panel: No results for input(s): VITAMINB12, FOLATE, FERRITIN, TIBC, IRON, RETICCTPCT in the last 72 hours. Sepsis Labs: No results for input(s): PROCALCITON, LATICACIDVEN in the last 168 hours.  Recent Results (from the past 240 hour(s))  Resp Panel by RT-PCR (Flu A&B, Covid) Nasopharyngeal Swab     Status: None   Collection Time: 11/11/20 11:19 PM   Specimen: Nasopharyngeal Swab; Nasopharyngeal(NP) swabs in vial transport medium  Result Value Ref Range Status   SARS Coronavirus 2 by RT PCR NEGATIVE NEGATIVE Final    Comment: (NOTE) SARS-CoV-2 target nucleic acids are NOT DETECTED.  The SARS-CoV-2 RNA is generally detectable in upper respiratory specimens during the acute phase of infection. The lowest concentration of SARS-CoV-2 viral copies this assay can detect is 138 copies/mL. A negative result does not preclude  SARS-Cov-2 infection and should not be used as the sole basis for treatment or other patient management decisions. A negative result may occur with  improper specimen collection/handling, submission of specimen other than nasopharyngeal swab, presence of viral mutation(s) within the areas targeted by this assay, and inadequate number of viral copies(<138 copies/mL). A negative result must be combined with clinical observations, patient history, and epidemiological information. The expected result is Negative.  Fact Sheet for Patients:  EntrepreneurPulse.com.au  Fact Sheet for Healthcare Providers:  IncredibleEmployment.be  This test is no t yet approved or cleared by the [place] FDA and  has been authorized for detection and/or diagnosis of SARS-CoV-2 by FDA under an Emergency Use Authorization (EUA). This EUA will remain  in effect (meaning this test can be used) for the duration of the COVID-19 declaration under Section 564(b)(1) of the Act, 21 U.S.C.section 360bbb-3(b)(1), unless the authorization is terminated  or revoked sooner.  Influenza A by PCR NEGATIVE NEGATIVE Final   Influenza B by PCR NEGATIVE NEGATIVE Final    Comment: (NOTE) The Xpert Xpress SARS-CoV-2/FLU/RSV plus assay is intended as an aid in the diagnosis of influenza from Nasopharyngeal swab specimens and should not be used as a sole basis for treatment. Nasal washings and aspirates are unacceptable for Xpert Xpress SARS-CoV-2/FLU/RSV testing.  Fact Sheet for Patients: EntrepreneurPulse.com.au  Fact Sheet for Healthcare Providers: IncredibleEmployment.be  This test is not yet approved or cleared by the [place] FDA and has been authorized for detection and/or diagnosis of SARS-CoV-2 by FDA under an Emergency Use Authorization (EUA). This EUA will remain in effect (meaning this test can be used) for the duration of  the COVID-19 declaration under Section 564(b)(1) of the Act, 21 U.S.C. section 360bbb-3(b)(1), unless the authorization is terminated or revoked.  Performed at [facility], [address] [place], [place] [address]          Radiology Studies: DG Chest Portable 1 View  Result Date: 11/11/2020 CLINICAL DATA:  Chest pain. EXAM: PORTABLE CHEST 1 VIEW COMPARISON:  Chest CT 03/19/2020. FINDINGS: Heart is normal in size for AP technique. Aortic tortuosity. Mild central bronchial thickening. No focal airspace disease. No pulmonary edema. No pleural effusion or pneumothorax. No acute osseous abnormalities are seen. IMPRESSION: Mild central bronchial thickening. Electronically Signed   By: Keith Rake M.D.   On: 11/11/2020 22:25        Scheduled Meds:  [START ON 11/13/2020] aspirin  325 mg Oral Daily   enoxaparin (LOVENOX) injection  1 mg/kg Subcutaneous Q12H   gabapentin  300 mg Oral TID   pantoprazole (PROTONIX) IV  40 mg Intravenous Q24H   Continuous Infusions:   LOS: 0 days   Time spent= 35 mins    Bonnell Placzek Arsenio Loader, MD [facility]  If 7PM-7AM, please contact night-coverage  11/12/2020, 7:50 AM
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
## 2020-11-12 NOTE — Progress Notes (Signed)
*  PRELIMINARY RESULTS* Echocardiogram 2D Echocardiogram has been performed.  Matthew Dickerson 11/12/2020, 9:36 AM
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
## 2020-11-12 NOTE — Progress Notes (Signed)
Lab called with Critical Troponin of 801. MD Amin notified.

## 2020-11-12 NOTE — Progress Notes (Signed)
Lab called with critical Troponin - 730. MD Amin notified. No new orders currently.

## 2020-11-12 NOTE — Progress Notes (Signed)
Report was called to nurse at [facility] of 6700. Patient has been informed that he has a bed at [facility].

## 2020-11-12 NOTE — Progress Notes (Signed)
Critical result- Troponin 705. MD Amin notified. No new orders currently.

## 2020-11-13 ENCOUNTER — Other Ambulatory Visit (HOSPITAL_COMMUNITY): Payer: Self-pay

## 2020-11-13 ENCOUNTER — Encounter (HOSPITAL_COMMUNITY)
Admission: EM | Disposition: A | Discharge status: Home / Self Care | Payer: Self-pay | Source: Home / Self Care | Attending: Internal Medicine

## 2020-11-13 DIAGNOSIS — R778 Other specified abnormalities of plasma proteins: Secondary | ICD-10-CM | POA: Diagnosis not present

## 2020-11-13 DIAGNOSIS — I25110 Atherosclerotic heart disease of native coronary artery with unstable angina pectoris: Secondary | ICD-10-CM | POA: Diagnosis present

## 2020-11-13 DIAGNOSIS — M19041 Primary osteoarthritis, right hand: Secondary | ICD-10-CM | POA: Diagnosis present

## 2020-11-13 DIAGNOSIS — Z8249 Family history of ischemic heart disease and other diseases of the circulatory system: Secondary | ICD-10-CM | POA: Diagnosis not present

## 2020-11-13 DIAGNOSIS — R001 Bradycardia, unspecified: Secondary | ICD-10-CM | POA: Diagnosis present

## 2020-11-13 DIAGNOSIS — R079 Chest pain, unspecified: Secondary | ICD-10-CM | POA: Diagnosis present

## 2020-11-13 DIAGNOSIS — F17210 Nicotine dependence, cigarettes, uncomplicated: Secondary | ICD-10-CM | POA: Diagnosis present

## 2020-11-13 DIAGNOSIS — Z955 Presence of coronary angioplasty implant and graft: Secondary | ICD-10-CM

## 2020-11-13 DIAGNOSIS — Z96653 Presence of artificial knee joint, bilateral: Secondary | ICD-10-CM | POA: Diagnosis present

## 2020-11-13 DIAGNOSIS — K219 Gastro-esophageal reflux disease without esophagitis: Secondary | ICD-10-CM | POA: Diagnosis not present

## 2020-11-13 DIAGNOSIS — F1290 Cannabis use, unspecified, uncomplicated: Secondary | ICD-10-CM | POA: Diagnosis not present

## 2020-11-13 DIAGNOSIS — Z7982 Long term (current) use of aspirin: Secondary | ICD-10-CM | POA: Diagnosis not present

## 2020-11-13 DIAGNOSIS — Z20822 Contact with and (suspected) exposure to covid-19: Secondary | ICD-10-CM | POA: Diagnosis not present

## 2020-11-13 DIAGNOSIS — E876 Hypokalemia: Secondary | ICD-10-CM | POA: Diagnosis not present

## 2020-11-13 DIAGNOSIS — G8929 Other chronic pain: Secondary | ICD-10-CM | POA: Diagnosis not present

## 2020-11-13 DIAGNOSIS — Z79899 Other long term (current) drug therapy: Secondary | ICD-10-CM | POA: Diagnosis not present

## 2020-11-13 DIAGNOSIS — I2510 Atherosclerotic heart disease of native coronary artery without angina pectoris: Secondary | ICD-10-CM | POA: Diagnosis not present

## 2020-11-13 DIAGNOSIS — F1490 Cocaine use, unspecified, uncomplicated: Secondary | ICD-10-CM | POA: Diagnosis not present

## 2020-11-13 DIAGNOSIS — I712 Thoracic aortic aneurysm, without rupture: Secondary | ICD-10-CM

## 2020-11-13 DIAGNOSIS — I214 Non-ST elevation (NSTEMI) myocardial infarction: Secondary | ICD-10-CM | POA: Diagnosis present

## 2020-11-13 DIAGNOSIS — Z85820 Personal history of malignant melanoma of skin: Secondary | ICD-10-CM | POA: Diagnosis not present

## 2020-11-13 DIAGNOSIS — Z23 Encounter for immunization: Secondary | ICD-10-CM | POA: Diagnosis present

## 2020-11-13 DIAGNOSIS — Z86718 Personal history of other venous thrombosis and embolism: Secondary | ICD-10-CM | POA: Diagnosis not present

## 2020-11-13 DIAGNOSIS — M19042 Primary osteoarthritis, left hand: Secondary | ICD-10-CM | POA: Diagnosis present

## 2020-11-13 DIAGNOSIS — I452 Bifascicular block: Secondary | ICD-10-CM | POA: Diagnosis not present

## 2020-11-13 DIAGNOSIS — I10 Essential (primary) hypertension: Secondary | ICD-10-CM | POA: Diagnosis not present

## 2020-11-13 HISTORY — PX: CORONARY STENT INTERVENTION: CATH118234

## 2020-11-13 HISTORY — PX: LEFT HEART CATH AND CORONARY ANGIOGRAPHY: CATH118249

## 2020-11-13 LAB — BASIC METABOLIC PANEL
Anion gap: 6 (ref 5–15)
BUN: 8 mg/dL (ref 8–23)
CO2: 27 mmol/L (ref 22–32)
Calcium: 9.3 mg/dL (ref 8.9–10.3)
Chloride: 105 mmol/L (ref 98–111)
Creatinine, Ser: 0.99 mg/dL (ref 0.61–1.24)
GFR, Estimated: >60 mL/min (ref >60)
Glucose, Bld: 115 mg/dL — ABNORMAL HIGH (ref 70–99)
Potassium: 4.6 mmol/L (ref 3.5–5.1)
Sodium: 138 mmol/L (ref 135–145)

## 2020-11-13 LAB — CBC
HCT: 41.7 % (ref 39.0–52.0)
Hemoglobin: 13.9 g/dL (ref 13.0–17.0)
MCH: 28.4 pg (ref 26.0–34.0)
MCHC: 33.3 g/dL (ref 30.0–36.0)
MCV: 85.3 fL (ref 80.0–100.0)
Platelets: 254 10*3/uL (ref 150–400)
RBC: 4.89 MIL/uL (ref 4.22–5.81)
RDW: 13.8 % (ref 11.5–15.5)
WBC: 7.3 10*3/uL (ref 4.0–10.5)
nRBC: 0 % (ref 0.0–0.2)

## 2020-11-13 LAB — POCT ACTIVATED CLOTTING TIME
Activated Clotting Time: 213 seconds
Activated Clotting Time: 277 seconds

## 2020-11-13 LAB — MAGNESIUM: Magnesium: 2.2 mg/dL (ref 1.7–2.4)

## 2020-11-13 LAB — HEMOGLOBIN A1C
Hgb A1c MFr Bld: 6 % — ABNORMAL HIGH (ref 4.8–5.6)
Mean Plasma Glucose: 126 mg/dL

## 2020-11-13 SURGERY — LEFT HEART CATH AND CORONARY ANGIOGRAPHY
Anesthesia: LOCAL

## 2020-11-13 MED ORDER — FENTANYL CITRATE (PF) 100 MCG/2ML IJ SOLN
INTRAMUSCULAR | Status: DC | PRN
  Administered 2020-11-13: 50 ug via INTRAVENOUS
  Administered 2020-11-13: 25 ug via INTRAVENOUS

## 2020-11-13 MED ORDER — PANTOPRAZOLE SODIUM 40 MG PO TBEC
40.0000 mg | DELAYED_RELEASE_TABLET | Freq: Every day | ORAL | Status: DC
  Administered 2020-11-14: 40 mg via ORAL
  Filled 2020-11-13: qty 1

## 2020-11-13 MED ORDER — LOSARTAN POTASSIUM 25 MG PO TABS
25.0000 mg | ORAL_TABLET | Freq: Every day | ORAL | Status: DC
  Administered 2020-11-14: 25 mg via ORAL
  Filled 2020-11-13: qty 1

## 2020-11-13 MED ORDER — TICAGRELOR 90 MG PO TABS
ORAL_TABLET | ORAL | Status: DC | PRN
  Administered 2020-11-13: 180 mg via ORAL

## 2020-11-13 MED ORDER — HEPARIN (PORCINE) IN NACL 1000-0.9 UT/500ML-% IV SOLN
INTRAVENOUS | Status: DC | PRN
  Administered 2020-11-13 (×2): 500 mL

## 2020-11-13 MED ORDER — HEPARIN SODIUM (PORCINE) 1000 UNIT/ML IJ SOLN
INTRAMUSCULAR | Status: AC
  Filled 2020-11-13: qty 1

## 2020-11-13 MED ORDER — NITROGLYCERIN 1 MG/10 ML FOR IR/CATH LAB
INTRA_ARTERIAL | Status: AC
  Filled 2020-11-13: qty 10

## 2020-11-13 MED ORDER — DIAZEPAM 5 MG PO TABS: 5.0000 mg | ORAL_TABLET | Freq: Four times a day (QID) | ORAL | Status: DC | PRN

## 2020-11-13 MED ORDER — VERAPAMIL HCL 2.5 MG/ML IV SOLN
INTRAVENOUS | Status: AC
  Filled 2020-11-13: qty 2

## 2020-11-13 MED ORDER — HYDRALAZINE HCL 20 MG/ML IJ SOLN: 10.0000 mg | INTRAMUSCULAR | Status: AC | PRN

## 2020-11-13 MED ORDER — ATORVASTATIN CALCIUM 80 MG PO TABS
80.0000 mg | ORAL_TABLET | Freq: Every day | ORAL | Status: DC
  Administered 2020-11-13 – 2020-11-14 (×2): 80 mg via ORAL
  Filled 2020-11-13 (×2): qty 1

## 2020-11-13 MED ORDER — WHITE PETROLATUM EX OINT
TOPICAL_OINTMENT | CUTANEOUS | Status: AC
  Administered 2020-11-13: 0.2
  Filled 2020-11-13: qty 28.35

## 2020-11-13 MED ORDER — ACETAMINOPHEN 325 MG PO TABS
650.0000 mg | ORAL_TABLET | ORAL | Status: DC | PRN
Start: 2020-11-13 — End: 2020-11-14

## 2020-11-13 MED ORDER — ENOXAPARIN SODIUM 40 MG/0.4ML IJ SOSY: 40.0000 mg | PREFILLED_SYRINGE | INTRAMUSCULAR | Status: DC

## 2020-11-13 MED ORDER — TICAGRELOR 90 MG PO TABS
ORAL_TABLET | ORAL | Status: AC
  Filled 2020-11-13: qty 2

## 2020-11-13 MED ORDER — VERAPAMIL HCL 2.5 MG/ML IV SOLN
INTRAVENOUS | Status: DC | PRN
  Administered 2020-11-13: 10 mL via INTRA_ARTERIAL

## 2020-11-13 MED ORDER — MIDAZOLAM HCL 2 MG/2ML IJ SOLN
INTRAMUSCULAR | Status: DC | PRN
  Administered 2020-11-13: 1 mg via INTRAVENOUS
  Administered 2020-11-13: 2 mg via INTRAVENOUS

## 2020-11-13 MED ORDER — ASPIRIN 81 MG PO CHEW: 81.0000 mg | CHEWABLE_TABLET | Freq: Every day | ORAL | Status: DC

## 2020-11-13 MED ORDER — LIDOCAINE HCL (PF) 1 % IJ SOLN
INTRAMUSCULAR | Status: AC
  Filled 2020-11-13: qty 30

## 2020-11-13 MED ORDER — PNEUMOCOCCAL VAC POLYVALENT 25 MCG/0.5ML IJ INJ
0.5000 mL | INJECTION | INTRAMUSCULAR | Status: AC
  Administered 2020-11-14: 0.5 mL via INTRAMUSCULAR
  Filled 2020-11-13: qty 0.5

## 2020-11-13 MED ORDER — SODIUM CHLORIDE 0.9% FLUSH: 3.0000 mL | INTRAVENOUS | Status: DC | PRN

## 2020-11-13 MED ORDER — SODIUM CHLORIDE 0.9 % IV SOLN: 250.0000 mL | INTRAVENOUS | Status: DC | PRN

## 2020-11-13 MED ORDER — ONDANSETRON HCL 4 MG/2ML IJ SOLN: 4.0000 mg | Freq: Four times a day (QID) | INTRAMUSCULAR | Status: DC | PRN

## 2020-11-13 MED ORDER — FENTANYL CITRATE (PF) 100 MCG/2ML IJ SOLN
INTRAMUSCULAR | Status: AC
  Filled 2020-11-13: qty 2

## 2020-11-13 MED ORDER — HEPARIN (PORCINE) IN NACL 1000-0.9 UT/500ML-% IV SOLN
INTRAVENOUS | Status: AC
  Filled 2020-11-13: qty 1000

## 2020-11-13 MED ORDER — LABETALOL HCL 5 MG/ML IV SOLN: 10.0000 mg | INTRAVENOUS | Status: AC | PRN

## 2020-11-13 MED ORDER — SODIUM CHLORIDE 0.9 % IV SOLN: INTRAVENOUS | Status: AC

## 2020-11-13 MED ORDER — SODIUM CHLORIDE 0.9% FLUSH
3.0000 mL | Freq: Two times a day (BID) | INTRAVENOUS | Status: DC
  Administered 2020-11-13 – 2020-11-14 (×2): 3 mL via INTRAVENOUS

## 2020-11-13 MED ORDER — HEPARIN SODIUM (PORCINE) 1000 UNIT/ML IJ SOLN
INTRAMUSCULAR | Status: DC | PRN
  Administered 2020-11-13: 5000 [IU] via INTRAVENOUS
  Administered 2020-11-13: 2000 [IU] via INTRAVENOUS
  Administered 2020-11-13: 6000 [IU] via INTRAVENOUS
  Administered 2020-11-13: 3000 [IU] via INTRAVENOUS

## 2020-11-13 MED ORDER — TICAGRELOR 90 MG PO TABS
90.0000 mg | ORAL_TABLET | Freq: Two times a day (BID) | ORAL | Status: DC
  Administered 2020-11-13 – 2020-11-14 (×2): 90 mg via ORAL
  Filled 2020-11-13 (×2): qty 1

## 2020-11-13 MED ORDER — INFLUENZA VAC A&B SA ADJ QUAD 0.5 ML IM PRSY
0.5000 mL | PREFILLED_SYRINGE | INTRAMUSCULAR | Status: AC
  Administered 2020-11-14: 0.5 mL via INTRAMUSCULAR
  Filled 2020-11-13: qty 0.5

## 2020-11-13 MED ORDER — MIDAZOLAM HCL 2 MG/2ML IJ SOLN
INTRAMUSCULAR | Status: AC
  Filled 2020-11-13: qty 2

## 2020-11-13 MED ORDER — NITROGLYCERIN 1 MG/10 ML FOR IR/CATH LAB
INTRA_ARTERIAL | Status: DC | PRN
  Administered 2020-11-13: 100 ug via INTRACORONARY

## 2020-11-13 MED ORDER — LIDOCAINE HCL (PF) 1 % IJ SOLN
INTRAMUSCULAR | Status: DC | PRN
  Administered 2020-11-13: 2 mL

## 2020-11-13 MED ORDER — IOHEXOL 350 MG/ML SOLN
INTRAVENOUS | Status: DC | PRN
  Administered 2020-11-13: 210 mL

## 2020-11-13 SURGICAL SUPPLY — 18 items
BALLN SAPPHIRE 2.5X20 (BALLOONS) ×2
BALLN SAPPHIRE ~~LOC~~ 3.25X15 (BALLOONS) ×2 IMPLANT
BALLOON SAPPHIRE 2.5X20 (BALLOONS) ×1 IMPLANT
CATH INFINITI JR4 5F (CATHETERS) ×2 IMPLANT
CATH OPTITORQUE TIG 4.0 5F (CATHETERS) ×2 IMPLANT
CATH VISTA GUIDE 6FR XBLAD3.5 (CATHETERS) ×2 IMPLANT
DEVICE RAD COMP TR BAND LRG (VASCULAR PRODUCTS) ×2 IMPLANT
GLIDESHEATH SLEND SS 6F .021 (SHEATH) ×2 IMPLANT
GUIDEWIRE INQWIRE 1.5J.035X260 (WIRE) ×1 IMPLANT
INQWIRE 1.5J .035X260CM (WIRE) ×2
KIT ENCORE 26 ADVANTAGE (KITS) ×2 IMPLANT
KIT HEART LEFT (KITS) ×2 IMPLANT
PACK CARDIAC CATHETERIZATION (CUSTOM PROCEDURE TRAY) ×2 IMPLANT
SHEATH PROBE COVER 6X72 (BAG) ×2 IMPLANT
STENT SYNERGY XD 3.0X32 (Permanent Stent) ×2 IMPLANT
TRANSDUCER W/STOPCOCK (MISCELLANEOUS) ×2 IMPLANT
TUBING CIL FLEX 10 FLL-RA (TUBING) ×2 IMPLANT
WIRE ASAHI PROWATER 180CM (WIRE) ×2 IMPLANT

## 2020-11-13 NOTE — TOC Benefit Eligibility Note (Signed)
Patient Teacher, English as a foreign language completed.    The patient is currently admitted and upon discharge could be taking Brilinta 90 mg.  The current 30 day co-pay is, $430.14 due to a $399.96 deductible remaining.   The patient is insured through [employer], [place] Patient Advocate Specialist [place] Team Direct Number: [phone]  [email or web address]: [phone]

## 2020-11-13 NOTE — Progress Notes (Signed)
DAILY PROGRESS NOTE   Patient Name: Matthew Dickerson Date of Encounter: 11/13/2020 Cardiologist: Carlyle Dolly, MD  Chief Complaint   No chest pain  Patient Profile   Matthew Dickerson is a 65 y.o. male with a hx of Thoracic aneursym who is being seen 11/12/2020 for the evaluation of chest pain at the request of Dr. Reesa Chew.  Subjective   No complaints.  Patient underwent radial cath this morning - found to have an 80% proximal LAD and 70% proximal to mid LAD stenosis.  He underwent a 3.0 x 32 mm Synergy drug-eluting stent covering both lesions and postdilated to 3.29 mm with no residual stenosis.  Dual antiplatelet therapy for minimum of 1 year was recommended as well as aggressive lipid-lowering and discontinuing drug use.  Echo yesterday showed diminished LVEF 45 to 50% with global hypokinesis.  Objective   Vitals:   11/13/20 0955 11/13/20 0959 11/13/20 1132 11/13/20 1136  BP: 130/87 130/87 (!) 155/104 (!) 160/80  Pulse: (!) 55 (!) 108 71   Resp: 10 20 17    Temp:  98.3 F (36.8 C) (!) 97.1 F (36.2 C)   TempSrc:  Oral Oral   SpO2: 100% 96%    Weight:      Height:        Intake/Output Summary (Last 24 hours) at 11/13/2020 1142 Last data filed at 11/13/2020 1000 Gross per 24 hour  Intake 1347.53 ml  Output 400 ml  Net 947.53 ml   Filed Weights   11/11/20 2121 11/12/20 0119 11/13/20 0128  Weight: 105 kg 111.6 kg 106.3 kg    Physical Exam   General appearance: alert and no distress Neck: no carotid bruit, no JVD, and thyroid not enlarged, symmetric, no tenderness/mass/nodules Lungs: clear to auscultation bilaterally Heart: regular rate and rhythm, S1, S2 normal, no murmur, click, rub or gallop Abdomen: soft, non-tender; bowel sounds normal; no masses,  no organomegaly Extremities: extremities normal, atraumatic, no cyanosis or edema Pulses: 2+ and symmetric Skin: Skin color, texture, turgor normal. No rashes or lesions Neurologic: Grossly normal Psych:  Pleasant  Inpatient Medications    Scheduled Meds:  amLODipine  5 mg Oral Daily   aspirin EC  81 mg Oral Daily   atorvastatin  80 mg Oral Daily   [START ON 11/14/2020] enoxaparin (LOVENOX) injection  40 mg Subcutaneous Q24H   gabapentin  300 mg Oral TID   [START ON 11/14/2020] influenza vaccine adjuvanted  0.5 mL Intramuscular Tomorrow-1000   pantoprazole (PROTONIX) IV  40 mg Intravenous Q24H   [START ON 11/14/2020] pneumococcal 23 valent vaccine  0.5 mL Intramuscular Tomorrow-1000   sodium chloride flush  3 mL Intravenous Q12H   ticagrelor  90 mg Oral BID    Continuous Infusions:  sodium chloride 125 mL/hr at 11/13/20 1100   sodium chloride      PRN Meds: sodium chloride, acetaminophen, diazepam, hydrALAZINE, hydrALAZINE, ipratropium-albuterol, labetalol, ondansetron (ZOFRAN) IV, oxyCODONE, senna-docusate, sodium chloride flush   Labs   Results for orders placed or performed during the hospital encounter of 11/11/20 (from the past 48 hour(s))  Urine rapid drug screen (hosp performed)     Status: Abnormal   Collection Time: 11/11/20  9:42 PM  Result Value Ref Range   Opiates NONE DETECTED NONE DETECTED   Cocaine POSITIVE (A) NONE DETECTED   Benzodiazepines NONE DETECTED NONE DETECTED   Amphetamines NONE DETECTED NONE DETECTED   Tetrahydrocannabinol POSITIVE (A) NONE DETECTED   Barbiturates NONE DETECTED NONE DETECTED    Comment: (NOTE)  DRUG SCREEN FOR MEDICAL PURPOSES ONLY.  IF CONFIRMATION IS NEEDED FOR ANY PURPOSE, NOTIFY LAB WITHIN 5 DAYS.  LOWEST DETECTABLE LIMITS FOR URINE DRUG SCREEN Drug Class                     Cutoff (ng/mL) Amphetamine and metabolites    1000 Barbiturate and metabolites    200 Benzodiazepine                 502 Tricyclics and metabolites     300 Opiates and metabolites        300 Cocaine and metabolites        300 THC                            50 Performed at [facility], [address], [place], [place] [address]   Comprehensive  metabolic panel     Status: Abnormal   Collection Time: 11/11/20 10:21 PM  Result Value Ref Range   Sodium 134 (L) 135 - 145 mmol/L   Potassium 3.4 (L) 3.5 - 5.1 mmol/L   Chloride 107 98 - 111 mmol/L   CO2 22 22 - 32 mmol/L   Glucose, Bld 119 (H) 70 - 99 mg/dL    Comment: Glucose reference range applies only to samples taken after fasting for at least 8 hours.   BUN 10 8 - 23 mg/dL   Creatinine, Ser 1.02 0.61 - 1.24 mg/dL   Calcium 8.7 (L) 8.9 - 10.3 mg/dL   Total Protein 7.4 6.5 - 8.1 g/dL   Albumin 3.8 3.5 - 5.0 g/dL   AST 13 (L) 15 - 41 U/L   ALT 11 0 - 44 U/L   Alkaline Phosphatase 95 38 - 126 U/L   Total Bilirubin 0.8 0.3 - 1.2 mg/dL   GFR, Estimated >60 >60 mL/min    Comment: (NOTE) Calculated using the CKD-EPI Creatinine Equation (2021)    Anion gap 5 5 - 15    Comment: Performed at [facility], [address] [place], [place] [address]  Troponin I (High Sensitivity)     Status: Abnormal   Collection Time: 11/11/20 10:21 PM  Result Value Ref Range   Troponin I (High Sensitivity) 66 (H) <18 ng/L    Comment: (NOTE) Elevated high sensitivity troponin I (hsTnI) values and significant  changes across serial measurements may suggest ACS but many other  chronic and acute conditions are known to elevate hsTnI results.  Refer to the "Links" section for chest pain algorithms and additional  guidance. Performed at [facility], [address] [place], [place] [address]   CBC     Status: Abnormal   Collection Time: 11/11/20 10:21 PM  Result Value Ref Range   WBC 11.8 (H) 4.0 - 10.5 K/uL   RBC 5.13 4.22 - 5.81 MIL/uL   Hemoglobin 14.7 13.0 - 17.0 g/dL   HCT 43.6 39.0 - 52.0 %   MCV 85.0 80.0 - 100.0 fL   MCH 28.7 26.0 - 34.0 pg   MCHC 33.7 30.0 - 36.0 g/dL   RDW 13.5 11.5 - 15.5 %   Platelets 261 150 - 400 K/uL   nRBC 0.0 0.0 - 0.2 %    Comment: Performed at [facility], [address] [place], [place] [address]  Resp Panel by RT-PCR (Flu A&B, Covid) Nasopharyngeal Swab      Status: None   Collection Time: 11/11/20 11:19 PM   Specimen: Nasopharyngeal  Swab; Nasopharyngeal(NP) swabs in vial transport medium  Result Value Ref Range   SARS Coronavirus 2 by RT PCR NEGATIVE NEGATIVE    Comment: (NOTE) SARS-CoV-2 target nucleic acids are NOT DETECTED.  The SARS-CoV-2 RNA is generally detectable in upper respiratory specimens during the acute phase of infection. The lowest concentration of SARS-CoV-2 viral copies this assay can detect is 138 copies/mL. A negative result does not preclude SARS-Cov-2 infection and should not be used as the sole basis for treatment or other patient management decisions. A negative result may occur with  improper specimen collection/handling, submission of specimen other than nasopharyngeal swab, presence of viral mutation(s) within the areas targeted by this assay, and inadequate number of viral copies(<138 copies/mL). A negative result must be combined with clinical observations, patient history, and epidemiological information. The expected result is Negative.  Fact Sheet for Patients:  EntrepreneurPulse.com.au  Fact Sheet for Healthcare Providers:  IncredibleEmployment.be  This test is no t yet approved or cleared by the [place] FDA and  has been authorized for detection and/or diagnosis of SARS-CoV-2 by FDA under an Emergency Use Authorization (EUA). This EUA will remain  in effect (meaning this test can be used) for the duration of the COVID-19 declaration under Section 564(b)(1) of the Act, 21 U.S.C.section 360bbb-3(b)(1), unless the authorization is terminated  or revoked sooner.       Influenza A by PCR NEGATIVE NEGATIVE   Influenza B by PCR NEGATIVE NEGATIVE    Comment: (NOTE) The Xpert Xpress SARS-CoV-2/FLU/RSV plus assay is intended as an aid in the diagnosis of influenza from Nasopharyngeal swab specimens and should not be used as a sole basis for treatment. Nasal  washings and aspirates are unacceptable for Xpert Xpress SARS-CoV-2/FLU/RSV testing.  Fact Sheet for Patients: EntrepreneurPulse.com.au  Fact Sheet for Healthcare Providers: IncredibleEmployment.be  This test is not yet approved or cleared by the [place] FDA and has been authorized for detection and/or diagnosis of SARS-CoV-2 by FDA under an Emergency Use Authorization (EUA). This EUA will remain in effect (meaning this test can be used) for the duration of the COVID-19 declaration under Section 564(b)(1) of the Act, 21 U.S.C. section 360bbb-3(b)(1), unless the authorization is terminated or revoked.  Performed at [facility], [address], [place], [place] [address]   Troponin I (High Sensitivity)     Status: Abnormal   Collection Time: 11/12/20 12:33 AM  Result Value Ref Range   Troponin I (High Sensitivity) 173 (HH) <18 ng/L    Comment: DELTA CHECK NOTED CRITICAL RESULT CALLED TO, READ BACK BY AND VERIFIED WITH: EVANS,T@0113  BY MATTHEWS,B 9.21.22 (NOTE) Elevated high sensitivity troponin I (hsTnI) values and significant  changes across serial measurements may suggest ACS but many other  chronic and acute conditions are known to elevate hsTnI results.  Refer to the Links section for chest pain algorithms and additional  guidance. Performed at [facility], [address], [place], [place] [address]   Troponin I (High Sensitivity)     Status: Abnormal   Collection Time: 11/12/20  2:46 AM  Result Value Ref Range   Troponin I (High Sensitivity) 282 (HH) <18 ng/L    Comment: DELTA CHECK NOTED CRITICAL RESULT CALLED TO, READ BACK BY AND VERIFIED WITH: Evans,T@0331  by matthews, b 9.21.22 (NOTE) Elevated high sensitivity troponin I (hsTnI) values and significant  changes across serial measurements may suggest ACS but many other  chronic and acute conditions are known to elevate hsTnI results.  Refer to the Links section for chest  pain  algorithms and additional  guidance. Performed at [facility], [address], [place], [place] [address]   HIV Antibody (routine testing w rflx)     Status: None   Collection Time: 11/12/20  2:46 AM  Result Value Ref Range   HIV Screen 4th Generation wRfx Non Reactive Non Reactive    Comment: Performed at [facility], [place] [address], [place], [place] [address]  Lipid panel     Status: Abnormal   Collection Time: 11/12/20  2:46 AM  Result Value Ref Range   Cholesterol 106 0 - 200 mg/dL   Triglycerides 39 <150 mg/dL   HDL 24 (L) >40 mg/dL   Total CHOL/HDL Ratio 4.4 RATIO   VLDL 8 0 - 40 mg/dL   LDL Cholesterol 74 0 - 99 mg/dL    Comment:        Total Cholesterol/HDL:CHD Risk Coronary Heart Disease Risk Table                     Men   Women  1/2 Average Risk   3.4   3.3  Average Risk       5.0   4.4  2 X Average Risk   9.6   7.1  3 X Average Risk  23.4   11.0        Use the calculated Patient Ratio above and the CHD Risk Table to determine the patient's CHD Risk.        ATP III CLASSIFICATION (LDL):  <100     mg/dL   Optimal  100-129  mg/dL   Near or Above                    Optimal  130-159  mg/dL   Borderline  160-189  mg/dL   High  >190     mg/dL   Very High Performed at [facility], [address] [place], [place] [address]   CBC     Status: None   Collection Time: 11/12/20  2:46 AM  Result Value Ref Range   WBC 9.5 4.0 - 10.5 K/uL   RBC 4.92 4.22 - 5.81 MIL/uL   Hemoglobin 14.2 13.0 - 17.0 g/dL   HCT 41.9 39.0 - 52.0 %   MCV 85.2 80.0 - 100.0 fL   MCH 28.9 26.0 - 34.0 pg   MCHC 33.9 30.0 - 36.0 g/dL   RDW 13.8 11.5 - 15.5 %   Platelets 279 150 - 400 K/uL   nRBC 0.0 0.0 - 0.2 %    Comment: Performed at [facility], [address], [place], [place] [address]  Comprehensive metabolic panel     Status: Abnormal   Collection Time: 11/12/20  2:46 AM  Result Value Ref Range   Sodium 134 (L) 135 - 145 mmol/L   Potassium 3.4 (L) 3.5 - 5.1 mmol/L   Chloride  104 98 - 111 mmol/L   CO2 24 22 - 32 mmol/L   Glucose, Bld 149 (H) 70 - 99 mg/dL    Comment: Glucose reference range applies only to samples taken after fasting for at least 8 hours.   BUN 11 8 - 23 mg/dL   Creatinine, Ser 0.92 0.61 - 1.24 mg/dL   Calcium 8.5 (L) 8.9 - 10.3 mg/dL   Total Protein 6.9 6.5 - 8.1 g/dL   Albumin 3.5 3.5 - 5.0 g/dL   AST 14 (L) 15 - 41 U/L   ALT 9 0 - 44  U/L   Alkaline Phosphatase 93 38 - 126 U/L   Total Bilirubin 0.6 0.3 - 1.2 mg/dL   GFR, Estimated >60 >60 mL/min    Comment: (NOTE) Calculated using the CKD-EPI Creatinine Equation (2021)    Anion gap 6 5 - 15    Comment: Performed at [facility], [address] [place], [place] [address]  Magnesium     Status: None   Collection Time: 11/12/20  2:46 AM  Result Value Ref Range   Magnesium 2.1 1.7 - 2.4 mg/dL    Comment: Performed at [facility], [address], [place], [place] [address]  Troponin I (High Sensitivity)     Status: Abnormal   Collection Time: 11/12/20  4:53 AM  Result Value Ref Range   Troponin I (High Sensitivity) 467 (HH) <18 ng/L    Comment: CRITICAL RESULT CALLED TO, READ BACK BY AND VERIFIED WITH: EVANS,T AT 6:20AM ON 11/12/20 BY FESTERMAN,C (NOTE) Elevated high sensitivity troponin I (hsTnI) values and significant  changes across serial measurements may suggest ACS but many other  chronic and acute conditions are known to elevate hsTnI results.  Refer to the Links section for chest pain algorithms and additional  guidance. Performed at [facility], [address], [place], [place] [address]   Troponin I (High Sensitivity)     Status: Abnormal   Collection Time: 11/12/20  7:13 AM  Result Value Ref Range   Troponin I (High Sensitivity) 705 (HH) <18 ng/L    Comment: CRITICAL RESULT CALLED TO, READ BACK BY AND VERIFIED WITH: GILLISPIE,H AT 8:40AM ON 11/12/20 BY FESTERMAN,C (NOTE) Elevated high sensitivity troponin I (hsTnI) values and significant  changes across serial  measurements may suggest ACS but many other  chronic and acute conditions are known to elevate hsTnI results.  Refer to the Links section for chest pain algorithms and additional  guidance. Performed at [facility], [address] [place], [place] [address]   TSH     Status: None   Collection Time: 11/12/20  8:29 AM  Result Value Ref Range   TSH 2.207 0.350 - 4.500 uIU/mL    Comment: Performed by a 3rd Generation assay with a functional sensitivity of <=0.01 uIU/mL. Performed at [facility], [address], [place], [place] [address]   Troponin I (High Sensitivity)     Status: Abnormal   Collection Time: 11/12/20  8:29 AM  Result Value Ref Range   Troponin I (High Sensitivity) 801 (HH) <18 ng/L    Comment: CRITICAL RESULT CALLED TO, READ BACK BY AND VERIFIED WITH: GILLISPIE,K AT 0929 ON 9.21.22 BY RUCINSKI,B (NOTE) Elevated high sensitivity troponin I (hsTnI) values and significant  changes across serial measurements may suggest ACS but many other  chronic and acute conditions are known to elevate hsTnI results.  Refer to the Links section for chest pain algorithms and additional  guidance. Performed at [facility], [address] [place], [place] [address]   Troponin I (High Sensitivity)     Status: Abnormal   Collection Time: 11/12/20 11:16 AM  Result Value Ref Range   Troponin I (High Sensitivity) 730 (HH) <18 ng/L    Comment: CRITICAL RESULT CALLED TO, READ BACK BY AND VERIFIED WITH: GILLISPIE,H AT 1258 ON 9.21.22 BY RUCINSKI, B (NOTE) Elevated high sensitivity troponin I (hsTnI) values and significant  changes across serial measurements may suggest ACS but many other  chronic and acute conditions are known to elevate hsTnI results.  Refer to the Links section for chest pain  algorithms and additional  guidance. Performed at [facility], [address], [place], [place] [address]   Basic metabolic panel     Status: Abnormal   Collection Time: 11/13/20  2:47 AM  Result  Value Ref Range   Sodium 138 135 - 145 mmol/L   Potassium 4.6 3.5 - 5.1 mmol/L   Chloride 105 98 - 111 mmol/L   CO2 27 22 - 32 mmol/L   Glucose, Bld 115 (H) 70 - 99 mg/dL    Comment: Glucose reference range applies only to samples taken after fasting for at least 8 hours.   BUN 8 8 - 23 mg/dL   Creatinine, Ser 0.99 0.61 - 1.24 mg/dL   Calcium 9.3 8.9 - 10.3 mg/dL   GFR, Estimated >60 >60 mL/min    Comment: (NOTE) Calculated using the CKD-EPI Creatinine Equation (2021)    Anion gap 6 5 - 15    Comment: Performed at [facility] [address] [place] [address], [place] [address]  CBC     Status: None   Collection Time: 11/13/20  2:47 AM  Result Value Ref Range   WBC 7.3 4.0 - 10.5 K/uL   RBC 4.89 4.22 - 5.81 MIL/uL   Hemoglobin 13.9 13.0 - 17.0 g/dL   HCT 41.7 39.0 - 52.0 %   MCV 85.3 80.0 - 100.0 fL   MCH 28.4 26.0 - 34.0 pg   MCHC 33.3 30.0 - 36.0 g/dL   RDW 13.8 11.5 - 15.5 %   Platelets 254 150 - 400 K/uL   nRBC 0.0 0.0 - 0.2 %    Comment: Performed at [facility], [place] [address] [place], [place] [address]  Magnesium     Status: None   Collection Time: 11/13/20  2:47 AM  Result Value Ref Range   Magnesium 2.2 1.7 - 2.4 mg/dL    Comment: Performed at [facility] [address], [place], [place] [address]    ECG   Sinus bradycardia with bifascicular block at 50- Personally Reviewed  Telemetry   Sinus bradycardia- Personally Reviewed  Radiology    CARDIAC CATHETERIZATION  Result Date: 11/13/2020   Mid LM lesion is 20% stenosed.   Prox LAD lesion is 80% stenosed.   Prox LAD to Mid LAD lesion is 70% stenosed.   A stent was successfully placed.   Post intervention, there is a 0% residual stenosis.   Post intervention, there is a 0% residual stenosis. Mild 20% smooth distal left main stenosis prior to trifurcation into a large LAD, ramus intermediate vessel, and small left circumflex vessel.  The LAD had proximal calcification with 80% eccentric smooth  proximal stenosis before small first diagonal branch followed by irregularity and 70% stenosis beyond this diagonal vessel. The ramus intermediate vessel was large caliber bifurcating vessel which was angiographically normal. The left circumflex vessel was small caliber and normal. The RCA was angiographically normal. Successful PCI to the LAD with ultimate insertion of a 3.0 x 32 mm Synergy DES stent to cover both lesions postdilated to 3.29 mm with the stenoses being reduced to 0%. LVEDP: 9 mmHg RECOMMENDATION: DAPT for minimum of 1 year.  Continue medical therapy for treatment of potential spasm and optimal blood pressure control.  The patient must discontinue drug use.  Aggressive lipid-lowering therapy with target LDL less than 70.   DG Chest Portable 1 View  Result Date: 11/11/2020 CLINICAL DATA:  Chest pain. EXAM: PORTABLE CHEST 1 VIEW COMPARISON:  Chest CT 03/19/2020.  FINDINGS: Heart is normal in size for AP technique. Aortic tortuosity. Mild central bronchial thickening. No focal airspace disease. No pulmonary edema. No pleural effusion or pneumothorax. No acute osseous abnormalities are seen. IMPRESSION: Mild central bronchial thickening. Electronically Signed   By: Keith Rake M.D.   On: 11/11/2020 22:25   ECHOCARDIOGRAM COMPLETE  Result Date: 11/12/2020    ECHOCARDIOGRAM REPORT   Patient Name:   Matthew Dickerson Date of Exam: 11/12/2020 Medical Rec #:  [id number]       Height:       73.0 in Accession #:    [id number]      Weight:       246.0 lb Date of Birth:  03/10/1955       BSA:          2.350 m Patient Age:    64 years        BP:           156/93 mmHg Patient Gender: M               HR:           58 bpm. Exam Location:  [facility] Procedure: 2D Echo, Cardiac Doppler and Color Doppler Indications:    Chest Pain  History:        Patient has no prior history of Echocardiogram examinations.                 Signs/Symptoms:Chest Pain. Ascending aortic aneurysm.  Sonographer:    Wenda Low  Referring Phys: [id number] ASIA B [place]  1. Left ventricular ejection fraction, by estimation, is 45 to 50%. The left ventricle has low normal function. The left ventricle demonstrates global hypokinesis. There is mild left ventricular hypertrophy. Left ventricular diastolic parameters are consistent with Grade I diastolic dysfunction (impaired relaxation).  2. Right ventricular systolic function is normal. The right ventricular size is normal. Tricuspid regurgitation signal is inadequate for assessing PA pressure.  3. The mitral valve is normal in structure. No evidence of mitral valve regurgitation. No evidence of mitral stenosis.  4. The aortic valve is tricuspid. There is mild calcification of the aortic valve. There is mild thickening of the aortic valve. Aortic valve regurgitation is not visualized. No aortic stenosis is present.  5. The inferior vena cava is dilated in size with >50% respiratory variability, suggesting right atrial pressure of 8 mmHg. FINDINGS  Left Ventricle: Left ventricular ejection fraction, by estimation, is 45 to 50%. The left ventricle has low normal function. The left ventricle demonstrates global hypokinesis. The left ventricular internal cavity size was normal in size. There is mild left ventricular hypertrophy. Left ventricular diastolic parameters are consistent with Grade I diastolic dysfunction (impaired relaxation). Normal left ventricular filling pressure. Right Ventricle: The right ventricular size is normal. No increase in right ventricular wall thickness. Right ventricular systolic function is normal. Tricuspid regurgitation signal is inadequate for assessing PA pressure. Left Atrium: Left atrial size was normal in size. Right Atrium: Right atrial size was normal in size. Pericardium: There is no evidence of pericardial effusion. Mitral Valve: The mitral valve is normal in structure. No evidence of mitral valve regurgitation. No evidence of mitral valve  stenosis. MV peak gradient, 2.1 mmHg. The mean mitral valve gradient is 1.0 mmHg. Tricuspid Valve: The tricuspid valve is normal in structure. Tricuspid valve regurgitation is not demonstrated. No evidence of tricuspid stenosis. Aortic Valve: The aortic valve is tricuspid. There is mild calcification of the aortic valve. There  is mild thickening of the aortic valve. There is mild aortic valve annular calcification. Aortic valve regurgitation is not visualized. No aortic stenosis  is present. Aortic valve mean gradient measures 5.0 mmHg. Aortic valve peak gradient measures 7.8 mmHg. Aortic valve area, by VTI measures 1.77 cm. Pulmonic Valve: The pulmonic valve was not well visualized. Pulmonic valve regurgitation is not visualized. No evidence of pulmonic stenosis. Aorta: The aortic root is normal in size and structure. Venous: The inferior vena cava is dilated in size with greater than 50% respiratory variability, suggesting right atrial pressure of 8 mmHg. IAS/Shunts: No atrial level shunt detected by color flow Doppler.  LEFT VENTRICLE PLAX 2D LVIDd:         5.30 cm      Diastology LVIDs:         4.10 cm      LV e' medial:    6.96 cm/s LV PW:         1.10 cm      LV E/e' medial:  8.3 LV IVS:        1.10 cm      LV e' lateral:   8.16 cm/s LVOT diam:     2.00 cm      LV E/e' lateral: 7.1 LV SV:         55 LV SV Index:   23 LVOT Area:     3.14 cm  LV Volumes (MOD) LV vol d, MOD A2C: 173.0 ml LV vol d, MOD A4C: 191.5 ml LV vol s, MOD A2C: 105.0 ml LV vol s, MOD A4C: 106.0 ml LV SV MOD A2C:     68.0 ml LV SV MOD A4C:     191.5 ml LV SV MOD BP:      79.8 ml RIGHT VENTRICLE RV Basal diam:  4.00 cm RV Mid diam:    3.50 cm RV S prime:     9.90 cm/s TAPSE (M-mode): 2.9 cm LEFT ATRIUM             Index       RIGHT ATRIUM           Index LA diam:        3.90 cm 1.66 cm/m  RA Area:     23.00 cm LA Vol (A2C):   97.0 ml 41.29 ml/m RA Volume:   69.20 ml  29.45 ml/m LA Vol (A4C):   66.9 ml 28.47 ml/m LA Biplane Vol: 83.6 ml  35.58 ml/m  AORTIC VALVE                    PULMONIC VALVE AV Area (Vmax):    1.77 cm     PV Vmax:       0.81 m/s AV Area (Vmean):   1.58 cm     PV Peak grad:  2.6 mmHg AV Area (VTI):     1.77 cm AV Vmax:           140.00 cm/s AV Vmean:          102.000 cm/s AV VTI:            0.308 m AV Peak Grad:      7.8 mmHg AV Mean Grad:      5.0 mmHg LVOT Vmax:         79.00 cm/s LVOT Vmean:        51.200 cm/s LVOT VTI:          0.174 m LVOT/AV VTI ratio: 0.56  AORTA Ao Root diam: 3.50 cm Ao Asc diam:  3.60 cm MITRAL VALVE MV Area (PHT): 3.31 cm    SHUNTS MV Area VTI:   2.04 cm    Systemic VTI:  0.17 m MV Peak grad:  2.1 mmHg    Systemic Diam: 2.00 cm MV Mean grad:  1.0 mmHg MV Vmax:       0.73 m/s MV Vmean:      40.3 cm/s MV Decel Time: 229 msec MV E velocity: 57.80 cm/s MV A velocity: 65.60 cm/s MV E/A ratio:  0.88 Carlyle Dolly MD Electronically signed by Carlyle Dolly MD Signature Date/Time: 11/12/2020/10:30:15 AM    Final     Cardiac Studies   See above  Assessment   Active Problems:   Ascending aortic aneurysm (HCC)   Chest pain   Elevated troponin   Hypokalemia   Coronary artery disease involving native coronary artery of native heart with unstable angina pectoris [facility])   Plan   Mr. Stogdill underwent PCI to the LAD successfully today.  He did well with the procedure.  He is noted to have a decreased LVEF of 45 to 50%.  He will likely need a repeat echo in 3 to 6 months to demonstrate hopeful improvement in LV function after PCI.  He has a history of a sending aortic aneurysm which has been stable by CT in January 2021 measuring 4.2 cm.  He is currently on GDMT with aspirin, high potency atorvastatin and Brilinta.  Bradycardia with bifascicular block will not likely allow a beta-blocker.  He has been on amlodipine for hypertension and possible coronary spasm.  Creatinine is normal.  Would recommend adding ARB especially given some decline in LV function.  Start losartan 25 mg daily.  Once his  radial TR band is removed this afternoon, he may be appropriate for same-day discharge and follow-up with Dr. Harl Bowie in [place].  Time Spent Directly with Patient:  I have spent a total of 25 minutes with the patient reviewing hospital notes, telemetry, EKGs, labs and examining the patient as well as establishing an assessment and plan that was discussed personally with the patient.  > 50% of time was spent in direct patient care.  Length of Stay:  LOS: 0 days   Pixie Casino, MD, [facility], [place] Director of the Advanced Lipid Disorders &  Cardiovascular Risk Reduction Clinic Diplomate of the American Board of Clinical Lipidology Attending Cardiologist  Direct Dial: [phone]  Fax: [phone]  Website:  [email or web address] Kylieann Eagles 11/13/2020, 11:42 AM

## 2020-11-13 NOTE — Interval H&P Note (Signed)
Cath Lab Visit (complete for each Cath Lab visit)  Clinical Evaluation Leading to the Procedure:   ACS: Yes.    Non-ACS:    Anginal Classification: CCS III  Anti-ischemic medical therapy: Minimal Therapy (1 class of medications)  Non-Invasive Test Results: No non-invasive testing performed  Prior CABG: No previous CABG      History and Physical Interval Note:  11/13/2020 7:47 AM  Matthew Dickerson  has presented today for surgery, with the diagnosis of chest pain.  The various methods of treatment have been discussed with the patient and family. After consideration of risks, benefits and other options for treatment, the patient has consented to  Procedure(s): LEFT HEART CATH AND CORONARY ANGIOGRAPHY (N/A) as a surgical intervention.  The patient's history has been reviewed, patient examined, no change in status, stable for surgery.  I have reviewed the patient's chart and labs.  Questions were answered to the patient's satisfaction.     Shelva Majestic

## 2020-11-13 NOTE — Progress Notes (Signed)
[facility] PROGRESS NOTE    Progress Note  JACKEY HOUSEY  YBO:[id number] DOB: August 31, 1955 DOA: 11/11/2020 PCP: Jake Samples, PA-C     Brief Narrative:   Matthew Dickerson is an 65 y.o. male history of angina, thoracic aortic aneurysm PVD comes into the hospital complaining of chest pain EKG shows sinus rhythm with right bundle branch block cardiology was consulted recommended transfer to [facility] for left heart cath    Assessment/Plan:   Elevated troponin/NSTEMI: Started on Lovenox, cardiac biomarkers continue to increase. 2D echo was done Cardiology was consulted recommended transfer to [facility] for left heart cath that showed moderate to severe coronary artery disease with successful PCI to the LAD. DAPT for minimum 1 year, aspirin and Brilinta.  Hypokalemia: Replete orally now resolved.  Tobacco abuse: Counseling.  Chronic pain: Continue gabapentin.  GERD:  Continue PPI.     DVT prophylaxis: lovenox Family Communication:none Status is: Inpatient  Remains inpatient appropriate because:Hemodynamically unstable  Dispo: The patient is from: Home              Anticipated d/c is to: Home              Patient currently is not medically stable to d/c.   Difficult to place patient No    Code Status:     Code Status Orders  (From admission, onward)           Start     Ordered   11/12/20 0118  Full code  Continuous        11/12/20 0117           Code Status History     Date Active Date Inactive Code Status Order ID Comments User Context   06/23/2020 1306 06/24/2020 1906 Full Code [id number]  Donia Ast, [place] Inpatient   04/14/2020 1235 04/15/2020 1804 Full Code [id number]  Donia Ast, [place] Inpatient      Advance Directive Documentation    Flowsheet Row Most Recent Value  Type of Advance Directive Living will  Pre-existing out of facility DNR order (yellow form or pink MOST form) --  "MOST" Form in Place? --         IV  Access:   Peripheral IV   Procedures and diagnostic studies:   CARDIAC CATHETERIZATION  Result Date: 11/13/2020   Mid LM lesion is 20% stenosed.   Prox LAD lesion is 80% stenosed.   Prox LAD to Mid LAD lesion is 70% stenosed.   A stent was successfully placed.   Post intervention, there is a 0% residual stenosis.   Post intervention, there is a 0% residual stenosis. Mild 20% smooth distal left main stenosis prior to trifurcation into a large LAD, ramus intermediate vessel, and small left circumflex vessel.  The LAD had proximal calcification with 80% eccentric smooth proximal stenosis before small first diagonal branch followed by irregularity and 70% stenosis beyond this diagonal vessel. The ramus intermediate vessel was large caliber bifurcating vessel which was angiographically normal. The left circumflex vessel was small caliber and normal. The RCA was angiographically normal. Successful PCI to the LAD with ultimate insertion of a 3.0 x 32 mm Synergy DES stent to cover both lesions postdilated to 3.29 mm with the stenoses being reduced to 0%. LVEDP: 9 mmHg RECOMMENDATION: DAPT for minimum of 1 year.  Continue medical therapy for treatment of potential spasm and optimal blood pressure control.  The patient must discontinue drug use.  Aggressive lipid-lowering therapy with target LDL less  than 70.   DG Chest Portable 1 View  Result Date: 11/11/2020 CLINICAL DATA:  Chest pain. EXAM: PORTABLE CHEST 1 VIEW COMPARISON:  Chest CT 03/19/2020. FINDINGS: Heart is normal in size for AP technique. Aortic tortuosity. Mild central bronchial thickening. No focal airspace disease. No pulmonary edema. No pleural effusion or pneumothorax. No acute osseous abnormalities are seen. IMPRESSION: Mild central bronchial thickening. Electronically Signed   By: Keith Rake M.D.   On: 11/11/2020 22:25   ECHOCARDIOGRAM COMPLETE  Result Date: 11/12/2020    ECHOCARDIOGRAM REPORT   Patient Name:   GRIFFYN Dickerson Date of  Exam: 11/12/2020 Medical Rec #:  [id number]       Height:       73.0 in Accession #:    [id number]      Weight:       246.0 lb Date of Birth:  [birth year]       BSA:          2.350 m Patient Age:    27 years        BP:           156/93 mmHg Patient Gender: M               HR:           58 bpm. Exam Location:  [facility] Procedure: 2D Echo, Cardiac Doppler and Color Doppler Indications:    Chest Pain  History:        Patient has no prior history of Echocardiogram examinations.                 Signs/Symptoms:Chest Pain. Ascending aortic aneurysm.  Sonographer:    Wenda Low Referring Phys: [id number] ASIA B [place]  1. Left ventricular ejection fraction, by estimation, is 45 to 50%. The left ventricle has low normal function. The left ventricle demonstrates global hypokinesis. There is mild left ventricular hypertrophy. Left ventricular diastolic parameters are consistent with Grade I diastolic dysfunction (impaired relaxation).  2. Right ventricular systolic function is normal. The right ventricular size is normal. Tricuspid regurgitation signal is inadequate for assessing PA pressure.  3. The mitral valve is normal in structure. No evidence of mitral valve regurgitation. No evidence of mitral stenosis.  4. The aortic valve is tricuspid. There is mild calcification of the aortic valve. There is mild thickening of the aortic valve. Aortic valve regurgitation is not visualized. No aortic stenosis is present.  5. The inferior vena cava is dilated in size with >50% respiratory variability, suggesting right atrial pressure of 8 mmHg. FINDINGS  Left Ventricle: Left ventricular ejection fraction, by estimation, is 45 to 50%. The left ventricle has low normal function. The left ventricle demonstrates global hypokinesis. The left ventricular internal cavity size was normal in size. There is mild left ventricular hypertrophy. Left ventricular diastolic parameters are consistent with Grade I diastolic  dysfunction (impaired relaxation). Normal left ventricular filling pressure. Right Ventricle: The right ventricular size is normal. No increase in right ventricular wall thickness. Right ventricular systolic function is normal. Tricuspid regurgitation signal is inadequate for assessing PA pressure. Left Atrium: Left atrial size was normal in size. Right Atrium: Right atrial size was normal in size. Pericardium: There is no evidence of pericardial effusion. Mitral Valve: The mitral valve is normal in structure. No evidence of mitral valve regurgitation. No evidence of mitral valve stenosis. MV peak gradient, 2.1 mmHg. The mean mitral valve gradient is 1.0 mmHg. Tricuspid Valve: The tricuspid valve is normal in  structure. Tricuspid valve regurgitation is not demonstrated. No evidence of tricuspid stenosis. Aortic Valve: The aortic valve is tricuspid. There is mild calcification of the aortic valve. There is mild thickening of the aortic valve. There is mild aortic valve annular calcification. Aortic valve regurgitation is not visualized. No aortic stenosis  is present. Aortic valve mean gradient measures 5.0 mmHg. Aortic valve peak gradient measures 7.8 mmHg. Aortic valve area, by VTI measures 1.77 cm. Pulmonic Valve: The pulmonic valve was not well visualized. Pulmonic valve regurgitation is not visualized. No evidence of pulmonic stenosis. Aorta: The aortic root is normal in size and structure. Venous: The inferior vena cava is dilated in size with greater than 50% respiratory variability, suggesting right atrial pressure of 8 mmHg. IAS/Shunts: No atrial level shunt detected by color flow Doppler.  LEFT VENTRICLE PLAX 2D LVIDd:         5.30 cm      Diastology LVIDs:         4.10 cm      LV e' medial:    6.96 cm/s LV PW:         1.10 cm      LV E/e' medial:  8.3 LV IVS:        1.10 cm      LV e' lateral:   8.16 cm/s LVOT diam:     2.00 cm      LV E/e' lateral: 7.1 LV SV:         55 LV SV Index:   23 LVOT Area:      3.14 cm  LV Volumes (MOD) LV vol d, MOD A2C: 173.0 ml LV vol d, MOD A4C: 191.5 ml LV vol s, MOD A2C: 105.0 ml LV vol s, MOD A4C: 106.0 ml LV SV MOD A2C:     68.0 ml LV SV MOD A4C:     191.5 ml LV SV MOD BP:      79.8 ml RIGHT VENTRICLE RV Basal diam:  4.00 cm RV Mid diam:    3.50 cm RV S prime:     9.90 cm/s TAPSE (M-mode): 2.9 cm LEFT ATRIUM             Index       RIGHT ATRIUM           Index LA diam:        3.90 cm 1.66 cm/m  RA Area:     23.00 cm LA Vol (A2C):   97.0 ml 41.29 ml/m RA Volume:   69.20 ml  29.45 ml/m LA Vol (A4C):   66.9 ml 28.47 ml/m LA Biplane Vol: 83.6 ml 35.58 ml/m  AORTIC VALVE                    PULMONIC VALVE AV Area (Vmax):    1.77 cm     PV Vmax:       0.81 m/s AV Area (Vmean):   1.58 cm     PV Peak grad:  2.6 mmHg AV Area (VTI):     1.77 cm AV Vmax:           140.00 cm/s AV Vmean:          102.000 cm/s AV VTI:            0.308 m AV Peak Grad:      7.8 mmHg AV Mean Grad:      5.0 mmHg LVOT Vmax:         79.00 cm/s  LVOT Vmean:        51.200 cm/s LVOT VTI:          0.174 m LVOT/AV VTI ratio: 0.56  AORTA Ao Root diam: 3.50 cm Ao Asc diam:  3.60 cm MITRAL VALVE MV Area (PHT): 3.31 cm    SHUNTS MV Area VTI:   2.04 cm    Systemic VTI:  0.17 m MV Peak grad:  2.1 mmHg    Systemic Diam: 2.00 cm MV Mean grad:  1.0 mmHg MV Vmax:       0.73 m/s MV Vmean:      40.3 cm/s MV Decel Time: 229 msec MV E velocity: 57.80 cm/s MV A velocity: 65.60 cm/s MV E/A ratio:  0.88 Carlyle Dolly MD Electronically signed by Carlyle Dolly MD Signature Date/Time: 11/12/2020/10:30:15 AM    Final      Medical Consultants:   None.   Subjective:    DONNIVAN VILLENA denies any chest pain little bit of bleeding from the cath site  Objective:    Vitals:   11/13/20 0955 11/13/20 0959 11/13/20 1132 11/13/20 1136  BP: 130/87 130/87 (!) 155/104 (!) 160/80  Pulse: (!) 55 (!) 108 71   Resp: 10 20 17    Temp:  98.3 F (36.8 C) (!) 97.1 F (36.2 C)   TempSrc:  Oral Oral   SpO2: 100% 96%    Weight:       Height:       SpO2: 96 % O2 Flow Rate (L/min): 2 L/min   Intake/Output Summary (Last 24 hours) at 11/13/2020 1139 Last data filed at 11/13/2020 1000 Gross per 24 hour  Intake 1347.53 ml  Output 400 ml  Net 947.53 ml   Filed Weights   11/11/20 2121 11/12/20 0119 11/13/20 0128  Weight: 105 kg 111.6 kg 106.3 kg    Exam: General exam: In no acute distress. Respiratory system: Good air movement and clear to auscultation. Cardiovascular system: S1 & S2 heard, RRR. No JVD.  Gastrointestinal system: Abdomen is nondistended, soft and nontender.  Extremities: No pedal edema. Skin: No rashes, lesions or ulcers Psychiatry: Judgement and insight appear normal. Mood & affect appropriate.    Data Reviewed:    Labs: Basic Metabolic Panel: Recent Labs  Lab 11/11/20 2221 11/12/20 0246 11/13/20 0247  NA 134* 134* 138  K 3.4* 3.4* 4.6  CL 107 104 105  CO2 22 24 27   GLUCOSE 119* 149* 115*  BUN 10 11 8   CREATININE 1.02 0.92 0.99  CALCIUM 8.7* 8.5* 9.3  MG  --  2.1 2.2   GFR Estimated Creatinine Clearance: 95.2 mL/min (by C-G formula based on SCr of 0.99 mg/dL). Liver Function Tests: Recent Labs  Lab 11/11/20 2221 11/12/20 0246  AST 13* 14*  ALT 11 9  ALKPHOS 95 93  BILITOT 0.8 0.6  PROT 7.4 6.9  ALBUMIN 3.8 3.5   No results for input(s): LIPASE, AMYLASE in the last 168 hours. No results for input(s): AMMONIA in the last 168 hours. Coagulation profile No results for input(s): INR, PROTIME in the last 168 hours. COVID-19 Labs  No results for input(s): DDIMER, FERRITIN, LDH, CRP in the last 72 hours.  Lab Results  Component Value Date   SARSCOV2NAA NEGATIVE 11/11/2020   SARSCOV2NAA NEGATIVE 06/19/2020   [place] NEGATIVE 04/10/2020   [place] Not Detected 04/02/2019    CBC: Recent Labs  Lab 11/11/20 2221 11/12/20 0246 11/13/20 0247  WBC 11.8* 9.5 7.3  HGB 14.7 14.2 13.9  HCT 43.6 41.9 41.7  MCV 85.0 85.2 85.3  PLT 261 279 254   Cardiac  Enzymes: No results for input(s): CKTOTAL, CKMB, CKMBINDEX, TROPONINI in the last 168 hours. BNP (last 3 results) No results for input(s): PROBNP in the last 8760 hours. CBG: No results for input(s): GLUCAP in the last 168 hours. D-Dimer: No results for input(s): DDIMER in the last 72 hours. Hgb A1c: No results for input(s): HGBA1C in the last 72 hours. Lipid Profile: Recent Labs    11/12/20 0246  CHOL 106  HDL 24*  LDLCALC 74  TRIG 39  CHOLHDL 4.4   Thyroid function studies: Recent Labs    11/12/20 0829  TSH 2.207   Anemia work up: No results for input(s): VITAMINB12, FOLATE, FERRITIN, TIBC, IRON, RETICCTPCT in the last 72 hours. Sepsis Labs: Recent Labs  Lab 11/11/20 2221 11/12/20 0246 11/13/20 0247  WBC 11.8* 9.5 7.3   Microbiology Recent Results (from the past 240 hour(s))  Resp Panel by RT-PCR (Flu A&B, Covid) Nasopharyngeal Swab     Status: None   Collection Time: 11/11/20 11:19 PM   Specimen: Nasopharyngeal Swab; Nasopharyngeal(NP) swabs in vial transport medium  Result Value Ref Range Status   SARS Coronavirus 2 by RT PCR NEGATIVE NEGATIVE Final    Comment: (NOTE) SARS-CoV-2 target nucleic acids are NOT DETECTED.  The SARS-CoV-2 RNA is generally detectable in upper respiratory specimens during the acute phase of infection. The lowest concentration of SARS-CoV-2 viral copies this assay can detect is 138 copies/mL. A negative result does not preclude SARS-Cov-2 infection and should not be used as the sole basis for treatment or other patient management decisions. A negative result may occur with  improper specimen collection/handling, submission of specimen other than nasopharyngeal swab, presence of viral mutation(s) within the areas targeted by this assay, and inadequate number of viral copies(<138 copies/mL). A negative result must be combined with clinical observations, patient history, and epidemiological information. The expected result is  Negative.  Fact Sheet for Patients:  EntrepreneurPulse.com.au  Fact Sheet for Healthcare Providers:  IncredibleEmployment.be  This test is no t yet approved or cleared by the [place] FDA and  has been authorized for detection and/or diagnosis of SARS-CoV-2 by FDA under an Emergency Use Authorization (EUA). This EUA will remain  in effect (meaning this test can be used) for the duration of the COVID-19 declaration under Section 564(b)(1) of the Act, 21 U.S.C.section 360bbb-3(b)(1), unless the authorization is terminated  or revoked sooner.       Influenza A by PCR NEGATIVE NEGATIVE Final   Influenza B by PCR NEGATIVE NEGATIVE Final    Comment: (NOTE) The Xpert Xpress SARS-CoV-2/FLU/RSV plus assay is intended as an aid in the diagnosis of influenza from Nasopharyngeal swab specimens and should not be used as a sole basis for treatment. Nasal washings and aspirates are unacceptable for Xpert Xpress SARS-CoV-2/FLU/RSV testing.  Fact Sheet for Patients: EntrepreneurPulse.com.au  Fact Sheet for Healthcare Providers: IncredibleEmployment.be  This test is not yet approved or cleared by the [place] FDA and has been authorized for detection and/or diagnosis of SARS-CoV-2 by FDA under an Emergency Use Authorization (EUA). This EUA will remain in effect (meaning this test can be used) for the duration of the COVID-19 declaration under Section 564(b)(1) of the Act, 21 U.S.C. section 360bbb-3(b)(1), unless the authorization is terminated or revoked.  Performed at [facility], [address], [place], [place] [address]      Medications:    amLODipine  5 mg Oral Daily  aspirin EC  81 mg Oral Daily   atorvastatin  80 mg Oral Daily   [START ON 11/14/2020] enoxaparin (LOVENOX) injection  40 mg Subcutaneous Q24H   gabapentin  300 mg Oral TID   [START ON 11/14/2020] influenza vaccine adjuvanted  0.5 mL  Intramuscular Tomorrow-1000   pantoprazole (PROTONIX) IV  40 mg Intravenous Q24H   [START ON 11/14/2020] pneumococcal 23 valent vaccine  0.5 mL Intramuscular Tomorrow-1000   sodium chloride flush  3 mL Intravenous Q12H   ticagrelor  90 mg Oral BID   Continuous Infusions:  sodium chloride 125 mL/hr at 11/13/20 1100   sodium chloride        LOS: 0 days   Charlynne Cousins  [facility]  11/13/2020, 11:39 AM

## 2020-11-13 NOTE — Progress Notes (Signed)
Patient left with [facility]. He stated he text his wife that he was leaving and was going to [facility]

## 2020-11-13 NOTE — Progress Notes (Signed)
Report given to [facility]. Notify patient that they are on their way.

## 2020-11-14 ENCOUNTER — Encounter (HOSPITAL_COMMUNITY): Payer: Self-pay | Admitting: Cardiovascular Disease

## 2020-11-14 ENCOUNTER — Other Ambulatory Visit (HOSPITAL_COMMUNITY): Payer: Self-pay

## 2020-11-14 LAB — BASIC METABOLIC PANEL
Anion gap: 6 (ref 5–15)
BUN: 7 mg/dL — ABNORMAL LOW (ref 8–23)
CO2: 26 mmol/L (ref 22–32)
Calcium: 8.9 mg/dL (ref 8.9–10.3)
Chloride: 105 mmol/L (ref 98–111)
Creatinine, Ser: 0.95 mg/dL (ref 0.61–1.24)
GFR, Estimated: >60 mL/min (ref >60)
Glucose, Bld: 97 mg/dL (ref 70–99)
Potassium: 3.7 mmol/L (ref 3.5–5.1)
Sodium: 137 mmol/L (ref 135–145)

## 2020-11-14 LAB — MAGNESIUM: Magnesium: 2 mg/dL (ref 1.7–2.4)

## 2020-11-14 LAB — CBC
HCT: 39.7 % (ref 39.0–52.0)
Hemoglobin: 13.3 g/dL (ref 13.0–17.0)
MCH: 28.2 pg (ref 26.0–34.0)
MCHC: 33.5 g/dL (ref 30.0–36.0)
MCV: 84.1 fL (ref 80.0–100.0)
Platelets: 232 10*3/uL (ref 150–400)
RBC: 4.72 MIL/uL (ref 4.22–5.81)
RDW: 13.6 % (ref 11.5–15.5)
WBC: 8.7 10*3/uL (ref 4.0–10.5)
nRBC: 0 % (ref 0.0–0.2)

## 2020-11-14 MED ORDER — ASPIRIN 81 MG PO TBEC
81.0000 mg | DELAYED_RELEASE_TABLET | Freq: Every day | ORAL | 11 refills | Status: DC
  Filled 2020-11-14: qty 30, 30d supply, fill #0

## 2020-11-14 MED ORDER — TICAGRELOR 90 MG PO TABS: 90.0000 mg | ORAL_TABLET | Freq: Two times a day (BID) | ORAL | 3 refills | Status: DC

## 2020-11-14 MED ORDER — ASPIRIN 81 MG PO TBEC: 81.0000 mg | DELAYED_RELEASE_TABLET | Freq: Every day | ORAL | 11 refills | Status: AC

## 2020-11-14 MED ORDER — ATORVASTATIN CALCIUM 80 MG PO TABS: 80.0000 mg | ORAL_TABLET | Freq: Every day | ORAL | 3 refills | Status: DC

## 2020-11-14 MED ORDER — TICAGRELOR 90 MG PO TABS
90.0000 mg | ORAL_TABLET | Freq: Two times a day (BID) | ORAL | 3 refills | Status: DC
  Filled 2020-11-14: qty 60, 30d supply, fill #0

## 2020-11-14 MED ORDER — LOSARTAN POTASSIUM 25 MG PO TABS: 25.0000 mg | ORAL_TABLET | Freq: Every day | ORAL | 3 refills | Status: DC

## 2020-11-14 MED ORDER — LOSARTAN POTASSIUM 25 MG PO TABS
25.0000 mg | ORAL_TABLET | Freq: Every day | ORAL | 3 refills | Status: DC
  Filled 2020-11-14: qty 30, 30d supply, fill #0

## 2020-11-14 MED ORDER — TICAGRELOR 90 MG PO TABS
90.0000 mg | ORAL_TABLET | Freq: Two times a day (BID) | ORAL | 0 refills | Status: DC
  Filled 2020-11-14: qty 60, 30d supply, fill #0

## 2020-11-14 MED ORDER — ATORVASTATIN CALCIUM 80 MG PO TABS
80.0000 mg | ORAL_TABLET | Freq: Every day | ORAL | 3 refills | Status: DC
  Filled 2020-11-14: qty 90, 90d supply, fill #0

## 2020-11-14 NOTE — Discharge Summary (Signed)
Physician Discharge Summary  Matthew Dickerson JQB:[id number] DOB: 1956-01-05 DOA: 11/11/2020  PCP: Jake Samples, PA-C  Admit date: 11/11/2020 Discharge date: 11/14/2020  Admitted From: Home Disposition:  Home  Recommendations for Outpatient Follow-up:  Follow up with Cardsin 1-2 weeks Please obtain BMP/CBC in one week   Home Health:no Equipment/Devices:none  Discharge Condition:Stable CODE STATUS:Full Diet recommendation: Heart Healthy   Brief/Interim Summary:  65 y.o. male history of angina, thoracic aortic aneurysm PVD comes into the hospital complaining of chest pain EKG shows sinus rhythm with right bundle branch block cardiology was consulted recommended transfer to [facility] for left heart cath  Discharge Diagnoses:  Active Problems:   Ascending aortic aneurysm (HCC)   Chest pain   Elevated troponin   Hypokalemia   Coronary artery disease involving native coronary artery of native heart with unstable angina pectoris (HCC)   S/P drug eluting coronary stent placement  NSTEMI/elevated troponin: He was started on Lovenox, aspirin, statins as cardiac biomarkers were checked which started to increase and he was complaining of chest pain. A 2D echo was done cardiology was consulted who recommended transfer to [facility] for left heart cath which was performed with a showed moderate to severe disease with a successful PCI to the LAD. Cardiology recommended a minimum of 1 year of DAPT therapy. 2D echo showed a mild drop in his EF he was started on losartan which she will continue as an outpatient.  Hypokalemia: Now resolved after oral repletion.  Tobacco abuse: Has been counseled.  Chronic pain: Continue gabapentin.  GERD: Continue PPI.     Discharge Instructions  Discharge Instructions     Diet - low sodium heart healthy   Complete by: As directed    Increase activity slowly   Complete by: As directed       Allergies as of 11/14/2020   No Known Allergies       Medication List     TAKE these medications    aspirin 81 MG EC tablet Take 1 tablet (81 mg total) by mouth daily. Swallow whole. What changed:  medication strength how much to take when to take this additional instructions   atorvastatin 80 MG tablet Commonly known as: LIPITOR Take 1 tablet (80 mg total) by mouth daily.   gabapentin 300 MG capsule Commonly known as: NEURONTIN Take 1 capsule (300 mg total) by mouth 3 (three) times daily. What changed: Another medication with the same name was removed. Continue taking this medication, and follow the directions you see here.   losartan 25 MG tablet Commonly known as: COZAAR Take 1 tablet (25 mg total) by mouth daily.   methocarbamol 500 MG tablet Commonly known as: ROBAXIN Take 1-2 tablets (500-1,000 mg total) by mouth every 6 (six) hours as needed for muscle spasms.   omeprazole 20 MG tablet Commonly known as: PRILOSEC OTC Take 20 mg by mouth daily as needed (indigestion).   oxyCODONE 5 MG immediate release tablet Commonly known as: Oxy IR/ROXICODONE Take 1 tablet (5 mg total) by mouth every 4 (four) hours as needed for moderate pain (pain score 4-6).   ticagrelor 90 MG Tabs tablet Commonly known as: BRILINTA Take 1 tablet (90 mg total) by mouth 2 (two) times daily.   tiZANidine 4 MG tablet Commonly known as: ZANAFLEX Take 1 tablet (4 mg total) by mouth every 6 (six) hours as needed for muscle spasms.        No Known Allergies  Consultations: Cardiology   Procedures/Studies: CARDIAC CATHETERIZATION  Result  Date: 11/13/2020   Mid LM lesion is 20% stenosed.   Prox LAD lesion is 80% stenosed.   Prox LAD to Mid LAD lesion is 70% stenosed.   A stent was successfully placed.   Post intervention, there is a 0% residual stenosis.   Post intervention, there is a 0% residual stenosis. Mild 20% smooth distal left main stenosis prior to trifurcation into a large LAD, ramus intermediate vessel, and small left circumflex  vessel.  The LAD had proximal calcification with 80% eccentric smooth proximal stenosis before small first diagonal branch followed by irregularity and 70% stenosis beyond this diagonal vessel. The ramus intermediate vessel was large caliber bifurcating vessel which was angiographically normal. The left circumflex vessel was small caliber and normal. The RCA was angiographically normal. Successful PCI to the LAD with ultimate insertion of a 3.0 x 32 mm Synergy DES stent to cover both lesions postdilated to 3.29 mm with the stenoses being reduced to 0%. LVEDP: 9 mmHg RECOMMENDATION: DAPT for minimum of 1 year.  Continue medical therapy for treatment of potential spasm and optimal blood pressure control.  The patient must discontinue drug use.  Aggressive lipid-lowering therapy with target LDL less than 70.   DG Chest Portable 1 View  Result Date: 11/11/2020 CLINICAL DATA:  Chest pain. EXAM: PORTABLE CHEST 1 VIEW COMPARISON:  Chest CT 03/19/2020. FINDINGS: Heart is normal in size for AP technique. Aortic tortuosity. Mild central bronchial thickening. No focal airspace disease. No pulmonary edema. No pleural effusion or pneumothorax. No acute osseous abnormalities are seen. IMPRESSION: Mild central bronchial thickening. Electronically Signed   By: Keith Rake M.D.   On: 11/11/2020 22:25   ECHOCARDIOGRAM COMPLETE  Result Date: 11/12/2020    ECHOCARDIOGRAM REPORT   Patient Name:   Matthew Dickerson Date of Exam: 11/12/2020 Medical Rec #:  [id number]       Height:       73.0 in Accession #:    [id number]      Weight:       246.0 lb Date of Birth:  Jul 10, 1955       BSA:          2.350 m Patient Age:    46 years        BP:           156/93 mmHg Patient Gender: M               HR:           58 bpm. Exam Location:  [facility] Procedure: 2D Echo, Cardiac Doppler and Color Doppler Indications:    Chest Pain  History:        Patient has no prior history of Echocardiogram examinations.                  Signs/Symptoms:Chest Pain. Ascending aortic aneurysm.  Sonographer:    Wenda Low Referring Phys: [id number] ASIA B [place]  1. Left ventricular ejection fraction, by estimation, is 45 to 50%. The left ventricle has low normal function. The left ventricle demonstrates global hypokinesis. There is mild left ventricular hypertrophy. Left ventricular diastolic parameters are consistent with Grade I diastolic dysfunction (impaired relaxation).  2. Right ventricular systolic function is normal. The right ventricular size is normal. Tricuspid regurgitation signal is inadequate for assessing PA pressure.  3. The mitral valve is normal in structure. No evidence of mitral valve regurgitation. No evidence of mitral stenosis.  4. The aortic valve is tricuspid. There is mild calcification  of the aortic valve. There is mild thickening of the aortic valve. Aortic valve regurgitation is not visualized. No aortic stenosis is present.  5. The inferior vena cava is dilated in size with >50% respiratory variability, suggesting right atrial pressure of 8 mmHg. FINDINGS  Left Ventricle: Left ventricular ejection fraction, by estimation, is 45 to 50%. The left ventricle has low normal function. The left ventricle demonstrates global hypokinesis. The left ventricular internal cavity size was normal in size. There is mild left ventricular hypertrophy. Left ventricular diastolic parameters are consistent with Grade I diastolic dysfunction (impaired relaxation). Normal left ventricular filling pressure. Right Ventricle: The right ventricular size is normal. No increase in right ventricular wall thickness. Right ventricular systolic function is normal. Tricuspid regurgitation signal is inadequate for assessing PA pressure. Left Atrium: Left atrial size was normal in size. Right Atrium: Right atrial size was normal in size. Pericardium: There is no evidence of pericardial effusion. Mitral Valve: The mitral valve is normal  in structure. No evidence of mitral valve regurgitation. No evidence of mitral valve stenosis. MV peak gradient, 2.1 mmHg. The mean mitral valve gradient is 1.0 mmHg. Tricuspid Valve: The tricuspid valve is normal in structure. Tricuspid valve regurgitation is not demonstrated. No evidence of tricuspid stenosis. Aortic Valve: The aortic valve is tricuspid. There is mild calcification of the aortic valve. There is mild thickening of the aortic valve. There is mild aortic valve annular calcification. Aortic valve regurgitation is not visualized. No aortic stenosis  is present. Aortic valve mean gradient measures 5.0 mmHg. Aortic valve peak gradient measures 7.8 mmHg. Aortic valve area, by VTI measures 1.77 cm. Pulmonic Valve: The pulmonic valve was not well visualized. Pulmonic valve regurgitation is not visualized. No evidence of pulmonic stenosis. Aorta: The aortic root is normal in size and structure. Venous: The inferior vena cava is dilated in size with greater than 50% respiratory variability, suggesting right atrial pressure of 8 mmHg. IAS/Shunts: No atrial level shunt detected by color flow Doppler.  LEFT VENTRICLE PLAX 2D LVIDd:         5.30 cm      Diastology LVIDs:         4.10 cm      LV e' medial:    6.96 cm/s LV PW:         1.10 cm      LV E/e' medial:  8.3 LV IVS:        1.10 cm      LV e' lateral:   8.16 cm/s LVOT diam:     2.00 cm      LV E/e' lateral: 7.1 LV SV:         55 LV SV Index:   23 LVOT Area:     3.14 cm  LV Volumes (MOD) LV vol d, MOD A2C: 173.0 ml LV vol d, MOD A4C: 191.5 ml LV vol s, MOD A2C: 105.0 ml LV vol s, MOD A4C: 106.0 ml LV SV MOD A2C:     68.0 ml LV SV MOD A4C:     191.5 ml LV SV MOD BP:      79.8 ml RIGHT VENTRICLE RV Basal diam:  4.00 cm RV Mid diam:    3.50 cm RV S prime:     9.90 cm/s TAPSE (M-mode): 2.9 cm LEFT ATRIUM             Index       RIGHT ATRIUM  Index LA diam:        3.90 cm 1.66 cm/m  RA Area:     23.00 cm LA Vol (A2C):   97.0 ml 41.29 ml/m RA  Volume:   69.20 ml  29.45 ml/m LA Vol (A4C):   66.9 ml 28.47 ml/m LA Biplane Vol: 83.6 ml 35.58 ml/m  AORTIC VALVE                    PULMONIC VALVE AV Area (Vmax):    1.77 cm     PV Vmax:       0.81 m/s AV Area (Vmean):   1.58 cm     PV Peak grad:  2.6 mmHg AV Area (VTI):     1.77 cm AV Vmax:           140.00 cm/s AV Vmean:          102.000 cm/s AV VTI:            0.308 m AV Peak Grad:      7.8 mmHg AV Mean Grad:      5.0 mmHg LVOT Vmax:         79.00 cm/s LVOT Vmean:        51.200 cm/s LVOT VTI:          0.174 m LVOT/AV VTI ratio: 0.56  AORTA Ao Root diam: 3.50 cm Ao Asc diam:  3.60 cm MITRAL VALVE MV Area (PHT): 3.31 cm    SHUNTS MV Area VTI:   2.04 cm    Systemic VTI:  0.17 m MV Peak grad:  2.1 mmHg    Systemic Diam: 2.00 cm MV Mean grad:  1.0 mmHg MV Vmax:       0.73 m/s MV Vmean:      40.3 cm/s MV Decel Time: 229 msec MV E velocity: 57.80 cm/s MV A velocity: 65.60 cm/s MV E/A ratio:  0.88 Carlyle Dolly MD Electronically signed by Carlyle Dolly MD Signature Date/Time: 11/12/2020/10:30:15 AM    Final    (Echo, Carotid, EGD, Colonoscopy, ERCP)    Subjective:   Discharge Exam: Vitals:   11/13/20 2340 11/14/20 0320  BP: (!) 155/74 (!) 148/84  Pulse: 62 79  Resp: 18 17  Temp: 98 F (36.7 C) 98.7 F (37.1 C)  SpO2: 100% 97%   Vitals:   11/13/20 1624 11/13/20 2042 11/13/20 2340 11/14/20 0320  BP: (!) 159/80 (!) 158/87 (!) 155/74 (!) 148/84  Pulse: 81 73 62 79  Resp: 20 16 18 17   Temp: 98.1 F (36.7 C) 98.5 F (36.9 C) 98 F (36.7 C) 98.7 F (37.1 C)  TempSrc: Oral Oral  Oral  SpO2: 99% 100% 100% 97%  Weight:      Height:        General: Pt is alert, awake, not in acute distress Cardiovascular: RRR, S1/S2 +, no rubs, no gallops Respiratory: CTA bilaterally, no wheezing, no rhonchi Abdominal: Soft, NT, ND, bowel sounds + Extremities: no edema, no cyanosis    The results of significant diagnostics from this hospitalization (including imaging, microbiology, ancillary  and laboratory) are listed below for reference.     Microbiology: Recent Results (from the past 240 hour(s))  Resp Panel by RT-PCR (Flu A&B, Covid) Nasopharyngeal Swab     Status: None   Collection Time: 11/11/20 11:19 PM   Specimen: Nasopharyngeal Swab; Nasopharyngeal(NP) swabs in vial transport medium  Result Value Ref Range Status   SARS Coronavirus 2 by RT PCR NEGATIVE NEGATIVE Final  Comment: (NOTE) SARS-CoV-2 target nucleic acids are NOT DETECTED.  The SARS-CoV-2 RNA is generally detectable in upper respiratory specimens during the acute phase of infection. The lowest concentration of SARS-CoV-2 viral copies this assay can detect is 138 copies/mL. A negative result does not preclude SARS-Cov-2 infection and should not be used as the sole basis for treatment or other patient management decisions. A negative result may occur with  improper specimen collection/handling, submission of specimen other than nasopharyngeal swab, presence of viral mutation(s) within the areas targeted by this assay, and inadequate number of viral copies(<138 copies/mL). A negative result must be combined with clinical observations, patient history, and epidemiological information. The expected result is Negative.  Fact Sheet for Patients:  EntrepreneurPulse.com.au  Fact Sheet for Healthcare Providers:  IncredibleEmployment.be  This test is no t yet approved or cleared by the [place] FDA and  has been authorized for detection and/or diagnosis of SARS-CoV-2 by FDA under an Emergency Use Authorization (EUA). This EUA will remain  in effect (meaning this test can be used) for the duration of the COVID-19 declaration under Section 564(b)(1) of the Act, 21 U.S.C.section 360bbb-3(b)(1), unless the authorization is terminated  or revoked sooner.       Influenza A by PCR NEGATIVE NEGATIVE Final   Influenza B by PCR NEGATIVE NEGATIVE Final    Comment:  (NOTE) The Xpert Xpress SARS-CoV-2/FLU/RSV plus assay is intended as an aid in the diagnosis of influenza from Nasopharyngeal swab specimens and should not be used as a sole basis for treatment. Nasal washings and aspirates are unacceptable for Xpert Xpress SARS-CoV-2/FLU/RSV testing.  Fact Sheet for Patients: EntrepreneurPulse.com.au  Fact Sheet for Healthcare Providers: IncredibleEmployment.be  This test is not yet approved or cleared by the [place] FDA and has been authorized for detection and/or diagnosis of SARS-CoV-2 by FDA under an Emergency Use Authorization (EUA). This EUA will remain in effect (meaning this test can be used) for the duration of the COVID-19 declaration under Section 564(b)(1) of the Act, 21 U.S.C. section 360bbb-3(b)(1), unless the authorization is terminated or revoked.  Performed at [facility], [address] [place], [place] [address]      Labs: BNP (last 3 results) No results for input(s): BNP in the last 8760 hours. Basic Metabolic Panel: Recent Labs  Lab 11/11/20 2221 11/12/20 0246 11/13/20 0247 11/14/20 0120  NA 134* 134* 138 137  K 3.4* 3.4* 4.6 3.7  CL 107 104 105 105  CO2 22 24 27 26   GLUCOSE 119* 149* 115* 97  BUN 10 11 8  7*  CREATININE 1.02 0.92 0.99 0.95  CALCIUM 8.7* 8.5* 9.3 8.9  MG  --  2.1 2.2 2.0   Liver Function Tests: Recent Labs  Lab 11/11/20 2221 11/12/20 0246  AST 13* 14*  ALT 11 9  ALKPHOS 95 93  BILITOT 0.8 0.6  PROT 7.4 6.9  ALBUMIN 3.8 3.5   No results for input(s): LIPASE, AMYLASE in the last 168 hours. No results for input(s): AMMONIA in the last 168 hours. CBC: Recent Labs  Lab 11/11/20 2221 11/12/20 0246 11/13/20 0247 11/14/20 0120  WBC 11.8* 9.5 7.3 8.7  HGB 14.7 14.2 13.9 13.3  HCT 43.6 41.9 41.7 39.7  MCV 85.0 85.2 85.3 84.1  PLT 261 279 254 232   Cardiac Enzymes: No results for input(s): CKTOTAL, CKMB, CKMBINDEX, TROPONINI in the last 168  hours. BNP: Invalid input(s): POCBNP CBG: No results for input(s): GLUCAP in the last 168 hours. D-Dimer No results for input(s):  DDIMER in the last 72 hours. Hgb A1c Recent Labs    11/13/20 0247  HGBA1C 6.0*   Lipid Profile Recent Labs    11/12/20 0246  CHOL 106  HDL 24*  LDLCALC 74  TRIG 39  CHOLHDL 4.4   Thyroid function studies Recent Labs    11/12/20 0829  TSH 2.207   Anemia work up No results for input(s): VITAMINB12, FOLATE, FERRITIN, TIBC, IRON, RETICCTPCT in the last 72 hours. Urinalysis No results found for: COLORURINE, APPEARANCEUR, [place], [place], GLUCOSEU, [place], [place], [place], PROTEINUR, UROBILINOGEN, NITRITE, LEUKOCYTESUR Sepsis Labs Invalid input(s): PROCALCITONIN,  WBC,  LACTICIDVEN Microbiology Recent Results (from the past 240 hour(s))  Resp Panel by RT-PCR (Flu A&B, Covid) Nasopharyngeal Swab     Status: None   Collection Time: 11/11/20 11:19 PM   Specimen: Nasopharyngeal Swab; Nasopharyngeal(NP) swabs in vial transport medium  Result Value Ref Range Status   SARS Coronavirus 2 by RT PCR NEGATIVE NEGATIVE Final    Comment: (NOTE) SARS-CoV-2 target nucleic acids are NOT DETECTED.  The SARS-CoV-2 RNA is generally detectable in upper respiratory specimens during the acute phase of infection. The lowest concentration of SARS-CoV-2 viral copies this assay can detect is 138 copies/mL. A negative result does not preclude SARS-Cov-2 infection and should not be used as the sole basis for treatment or other patient management decisions. A negative result may occur with  improper specimen collection/handling, submission of specimen other than nasopharyngeal swab, presence of viral mutation(s) within the areas targeted by this assay, and inadequate number of viral copies(<138 copies/mL). A negative result must be combined with clinical observations, patient history, and epidemiological information. The expected result is Negative.  Fact  Sheet for Patients:  EntrepreneurPulse.com.au  Fact Sheet for Healthcare Providers:  IncredibleEmployment.be  This test is no t yet approved or cleared by the [place] FDA and  has been authorized for detection and/or diagnosis of SARS-CoV-2 by FDA under an Emergency Use Authorization (EUA). This EUA will remain  in effect (meaning this test can be used) for the duration of the COVID-19 declaration under Section 564(b)(1) of the Act, 21 U.S.C.section 360bbb-3(b)(1), unless the authorization is terminated  or revoked sooner.       Influenza A by PCR NEGATIVE NEGATIVE Final   Influenza B by PCR NEGATIVE NEGATIVE Final    Comment: (NOTE) The Xpert Xpress SARS-CoV-2/FLU/RSV plus assay is intended as an aid in the diagnosis of influenza from Nasopharyngeal swab specimens and should not be used as a sole basis for treatment. Nasal washings and aspirates are unacceptable for Xpert Xpress SARS-CoV-2/FLU/RSV testing.  Fact Sheet for Patients: EntrepreneurPulse.com.au  Fact Sheet for Healthcare Providers: IncredibleEmployment.be  This test is not yet approved or cleared by the [place] FDA and has been authorized for detection and/or diagnosis of SARS-CoV-2 by FDA under an Emergency Use Authorization (EUA). This EUA will remain in effect (meaning this test can be used) for the duration of the COVID-19 declaration under Section 564(b)(1) of the Act, 21 U.S.C. section 360bbb-3(b)(1), unless the authorization is terminated or revoked.  Performed at [facility], [address] [place], [place] [address]      Time coordinating discharge: Over 30 minutes  SIGNED:   Charlynne Cousins, MD  [facility] 11/14/2020, 7:57 AM Pager   If 7PM-7AM, please contact night-coverage [email or web address] Password [email or web address]

## 2020-11-14 NOTE — Progress Notes (Signed)
CARDIAC REHAB PHASE I   PRE:  Rate/Rhythm: 87 SR  BP:  Supine:   Sitting: 156/91  Standing:    SaO2: 96%RA  MODE:  Ambulation: 470 ft   POST:  Rate/Rhythm: 96 SR PACs/PVCs  BP:  Supine:   Sitting: 149/98  Standing:    SaO2: 98%RA 0755-0845 Pt walked 470 ft on RA with steady gait and no CP. Tolerated well. MI education completed with pt who voiced understanding. Discussed importance of brilinta with stent. Reviewed NTG use, MI restrictions, walking for ex, heart healthy food choices and not smoking . Pt stated he is going to stop smoking and make needed changes to stay healthy. Discussed CRP 2 and referred to [facility] program. Pt is tearful at times and emotional support given. Stated he is happy to be given chance to make healthy changes.   Graylon Good, RN BSN  11/14/2020 9:27 AM
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
## 2020-11-25 ENCOUNTER — Telehealth: Payer: Self-pay | Admitting: Family Medicine

## 2020-11-25 ENCOUNTER — Ambulatory Visit (INDEPENDENT_AMBULATORY_CARE_PROVIDER_SITE_OTHER): Payer: Medicare Other | Admitting: Family Medicine

## 2020-11-25 ENCOUNTER — Encounter: Payer: Self-pay | Admitting: Family Medicine

## 2020-11-25 VITALS — BP 106/70 | HR 76 | Ht 72.0 in | Wt 242.6 lb

## 2020-11-25 DIAGNOSIS — E782 Mixed hyperlipidemia: Secondary | ICD-10-CM | POA: Diagnosis not present

## 2020-11-25 DIAGNOSIS — I7121 Aneurysm of the ascending aorta, without rupture: Secondary | ICD-10-CM | POA: Diagnosis not present

## 2020-11-25 DIAGNOSIS — I252 Old myocardial infarction: Secondary | ICD-10-CM | POA: Diagnosis not present

## 2020-11-25 MED ORDER — TICAGRELOR 90 MG PO TABS: 90.0000 mg | ORAL_TABLET | Freq: Two times a day (BID) | ORAL | 2 refills | Status: DC

## 2020-11-25 MED ORDER — NITROGLYCERIN 0.4 MG SL SUBL: 0.4000 mg | SUBLINGUAL_TABLET | SUBLINGUAL | 1 refills | Status: DC | PRN

## 2020-11-25 NOTE — Patient Instructions (Addendum)

## 2020-11-25 NOTE — Telephone Encounter (Signed)
Magda Paganini -wife called stating that the Brilinta was $879.00 for a 3 month supply. They cannot afford this.

## 2020-11-25 NOTE — Telephone Encounter (Signed)
Called patient and wife and gave number to call [employer] [facility] [phone]

## 2020-11-26 ENCOUNTER — Other Ambulatory Visit: Payer: Self-pay | Admitting: *Deleted

## 2020-11-26 MED ORDER — TICAGRELOR 90 MG PO TABS
90.0000 mg | ORAL_TABLET | Freq: Two times a day (BID) | ORAL | 3 refills | Status: DC
Start: 2020-11-26 — End: 2021-11-02

## 2020-11-26 NOTE — Telephone Encounter (Signed)
Advised that rx would be signed by Katina Dung, NP tomorrow upon arrival to office and then faxed to [employer]. Verbalized understanding.

## 2020-11-26 NOTE — Telephone Encounter (Signed)
Pt's wife called stating that the Rx for Brilinta needs to be faxed to [phone] for a 90 days supply

## 2020-12-01 ENCOUNTER — Telehealth (HOSPITAL_COMMUNITY): Payer: Self-pay

## 2020-12-01 ENCOUNTER — Other Ambulatory Visit (HOSPITAL_COMMUNITY): Payer: Self-pay

## 2020-12-01 NOTE — Telephone Encounter (Signed)
Transitions of Care Pharmacy  ° °Call attempted for a pharmacy transitions of care follow-up. HIPAA appropriate voicemail was left with call back information provided.  ° °Call attempt #1. Will follow-up in 2-3 days.  °  °

## 2020-12-02 ENCOUNTER — Telehealth (HOSPITAL_COMMUNITY): Payer: Self-pay | Admitting: Pharmacist

## 2020-12-02 ENCOUNTER — Other Ambulatory Visit (HOSPITAL_COMMUNITY): Payer: Self-pay

## 2020-12-02 NOTE — Telephone Encounter (Signed)
Transitions of Care Pharmacy   Call attempted for a pharmacy transitions of care follow-up. HIPAA appropriate voicemail was left with call back information provided.   Call attempt #2. Will follow-up in 2-3 days.    

## 2020-12-03 ENCOUNTER — Telehealth (HOSPITAL_COMMUNITY): Payer: Self-pay | Admitting: Pharmacist

## 2020-12-03 ENCOUNTER — Other Ambulatory Visit (HOSPITAL_COMMUNITY): Payer: Self-pay

## 2020-12-03 NOTE — Telephone Encounter (Signed)
Pharmacy Transitions of Care Follow-up Telephone Call  Date of discharge: 11/14/20  Discharge Diagnosis: NSTEMI w/stent  Spoke with wife, Matthew Dickerson.  How have you been since you were released from the hospital?  Doing well  Medication changes made at discharge:           START taking: atorvastatin (LIPITOR)  losartan (COZAAR)  Brilinta CHANGE how you take: aspirin  Icon medications to stop taking   STOP taking: gabapentin 300 MG capsule (NEURONTIN)   Medication changes verified by the patient? Yes    Medication Accessibility:  Home Pharmacy: [facility], [place]  Was the patient provided with refills on discharged medications? No, but new rx already sent to home pharmacy  Have all prescriptions been transferred from [facility] to home pharmacy? N/A   Is the patient able to afford medications? MAP already approved   Medication Review:  TICAGRELOR (BRILINTA) Ticagrelor 90 mg BID initiated on 11/14/20.  - Educated patient on expected duration of therapy of aspirin with ticagrelor. 1 year - Discussed importance of taking medication around the same time every day, - Advised patient of medications to avoid (NSAIDs, aspirin maintenance doses>100 mg daily) - Educated that Tylenol (acetaminophen) will be the preferred analgesic to prevent risk of bleeding  - Emphasized importance of monitoring for signs and symptoms of bleeding (abnormal bruising, prolonged bleeding, nose bleeds, bleeding from gums, discolored urine, black tarry stools)  - Educated patient to notify doctor if shortness of breath or abnormal heartbeat occur - Advised patient to alert all providers of antiplatelet therapy prior to starting a new medication or having a procedure   Follow-up Appointments:  PCP Hospital f/u appt confirmed? None currently scheduled  Specialist Hospital f/u appt confirmed? Completed appt with Dr. Leonides Sake on 11/25/20 @ Cardiology.    If their condition worsens, is the pt aware to call  PCP or go to the Emergency Dept.? Yes  Final Patient Assessment: Will follow-up again with cardiology in 6 months, refills have been sent to home pharmacy and pt has been approved for med assistance for brilinta and will get this from [facility].

## 2020-12-08 DIAGNOSIS — I10 Essential (primary) hypertension: Secondary | ICD-10-CM | POA: Insufficient documentation

## 2020-12-08 DIAGNOSIS — M5440 Lumbago with sciatica, unspecified side: Secondary | ICD-10-CM | POA: Insufficient documentation

## 2020-12-08 DIAGNOSIS — I252 Old myocardial infarction: Secondary | ICD-10-CM | POA: Insufficient documentation

## 2020-12-18 ENCOUNTER — Ambulatory Visit: Payer: Medicare Other

## 2020-12-18 ENCOUNTER — Ambulatory Visit (INDEPENDENT_AMBULATORY_CARE_PROVIDER_SITE_OTHER): Payer: Medicare Other | Admitting: Orthopedic Surgery

## 2020-12-18 ENCOUNTER — Telehealth: Payer: Self-pay | Admitting: Orthopedic Surgery

## 2020-12-18 ENCOUNTER — Encounter: Payer: Self-pay | Admitting: Orthopedic Surgery

## 2020-12-18 ENCOUNTER — Other Ambulatory Visit: Payer: Self-pay

## 2020-12-18 VITALS — BP 115/84 | HR 80 | Ht 72.75 in | Wt 249.0 lb

## 2020-12-18 DIAGNOSIS — M545 Low back pain, unspecified: Secondary | ICD-10-CM

## 2020-12-18 DIAGNOSIS — M47816 Spondylosis without myelopathy or radiculopathy, lumbar region: Secondary | ICD-10-CM

## 2020-12-18 DIAGNOSIS — M79605 Pain in left leg: Secondary | ICD-10-CM

## 2020-12-18 MED ORDER — PREDNISONE 10 MG (21) PO TBPK: ORAL_TABLET | ORAL | 0 refills | Status: DC

## 2020-12-18 MED ORDER — METHOCARBAMOL 500 MG PO TABS: 500.0000 mg | ORAL_TABLET | Freq: Three times a day (TID) | ORAL | 1 refills | Status: DC

## 2020-12-18 MED ORDER — GABAPENTIN 100 MG PO CAPS: 100.0000 mg | ORAL_CAPSULE | Freq: Three times a day (TID) | ORAL | 2 refills | Status: DC

## 2020-12-18 NOTE — Telephone Encounter (Signed)
Called to clarify Should be 21 tablets  6 d pack
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
## 2020-12-18 NOTE — Telephone Encounter (Signed)
Mickel Baas with [facility] called and they have a question concerning patient prescription.   Call back number [phone]

## 2020-12-18 NOTE — Patient Instructions (Signed)
This shouldn't require surgery  You have signs of a pinched nerve   Our practice does not handle back problems   If you continue to have pain after 6 weeks of treatment see your primary care doctor for MRI and referral to neurosurgery

## 2021-01-30 ENCOUNTER — Encounter: Payer: Self-pay | Admitting: *Deleted

## 2021-02-07 ENCOUNTER — Other Ambulatory Visit: Payer: Self-pay | Admitting: Orthopedic Surgery

## 2021-02-07 DIAGNOSIS — M545 Low back pain, unspecified: Secondary | ICD-10-CM

## 2021-02-27 ENCOUNTER — Encounter: Payer: Self-pay | Admitting: Cardiology

## 2021-02-27 ENCOUNTER — Other Ambulatory Visit: Payer: Self-pay | Admitting: Orthopedic Surgery

## 2021-02-27 DIAGNOSIS — M79605 Pain in left leg: Secondary | ICD-10-CM

## 2021-02-27 DIAGNOSIS — M545 Low back pain, unspecified: Secondary | ICD-10-CM

## 2021-02-27 NOTE — Telephone Encounter (Signed)
error 

## 2021-03-02 ENCOUNTER — Telehealth: Payer: Self-pay | Admitting: Radiology

## 2021-03-02 NOTE — Telephone Encounter (Signed)
I called to advise Ok to d/c both

## 2021-03-02 NOTE — Telephone Encounter (Signed)
Wife called, asked if patient still needed to be taking methocarbomal or neurontin.  He is not having any muscle spasms, nor nerve pain.  Please call her to advise.

## 2021-03-02 NOTE — Telephone Encounter (Signed)
° ° ° ° ° ° ° °  S Top                                          Ok to stop

## 2021-03-12 ENCOUNTER — Telehealth: Payer: Self-pay | Admitting: Cardiology

## 2021-03-12 MED ORDER — NITROGLYCERIN 0.4 MG SL SUBL: 0.4000 mg | SUBLINGUAL_TABLET | SUBLINGUAL | 2 refills | Status: AC | PRN

## 2021-03-12 NOTE — Telephone Encounter (Signed)
°*  STAT* If patient is at the pharmacy, call can be transferred to refill team.   1. Which medications need to be refilled? (please list name of each medication and dose if known) nitroGLYCERIN (NITROSTAT) 0.4 MG SL tablet  2. Which pharmacy/location (including street and city if local pharmacy) is medication to be sent to? [place], [place] [address]  3. Do they need a 30 day or 90 day supply? 30 ds

## 2021-03-28 ENCOUNTER — Ambulatory Visit
Admission: EM | Admit: 2021-03-28 | Discharge: 2021-03-28 | Disposition: A | Discharge status: Home / Self Care | Payer: Medicare Other | Attending: Urgent Care | Admitting: Urgent Care

## 2021-03-28 ENCOUNTER — Encounter: Payer: Self-pay | Admitting: Emergency Medicine

## 2021-03-28 ENCOUNTER — Other Ambulatory Visit: Payer: Self-pay

## 2021-03-28 DIAGNOSIS — K089 Disorder of teeth and supporting structures, unspecified: Secondary | ICD-10-CM

## 2021-03-28 DIAGNOSIS — Z8679 Personal history of other diseases of the circulatory system: Secondary | ICD-10-CM

## 2021-03-28 DIAGNOSIS — K047 Periapical abscess without sinus: Secondary | ICD-10-CM

## 2021-03-28 DIAGNOSIS — I719 Aortic aneurysm of unspecified site, without rupture: Secondary | ICD-10-CM

## 2021-03-28 DIAGNOSIS — I252 Old myocardial infarction: Secondary | ICD-10-CM

## 2021-03-28 MED ORDER — AMOXICILLIN-POT CLAVULANATE 875-125 MG PO TABS: 1.0000 | ORAL_TABLET | Freq: Two times a day (BID) | ORAL | 0 refills | Status: DC

## 2021-03-28 NOTE — Discharge Instructions (Addendum)
[facility] in [place], [place] Address: [address], [place], [place] [address] Phone: [phone]  [place] [phone] extension [phone] [address]  Dr. Donn Pierini [phone] [place] [phone] [address] [facility] [place].  [facility] [phone] extension [phone] [place], [place], [address] First come first serve for the first 10 clients.  May do simple extractions only, no wisdom teeth or surgery.  You may try the second for Thursday of the month starting at [place] [facility] [employer] You may call the school to see if they are still helping to provide dental care for emergent cases.
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
## 2021-03-28 NOTE — ED Triage Notes (Signed)
Patient c/o abscess in mouth x 2 days, history of dental injury, currently on blood thinners unable to have dental surgery.  Patient states he's in a lot of pain and unable to sleep.

## 2021-05-26 ENCOUNTER — Encounter: Payer: Self-pay | Admitting: *Deleted

## 2021-05-26 NOTE — Progress Notes (Signed)
This encounter was created in error - please disregard.

## 2021-06-04 ENCOUNTER — Ambulatory Visit (INDEPENDENT_AMBULATORY_CARE_PROVIDER_SITE_OTHER): Payer: Medicare Other | Admitting: Cardiology

## 2021-06-04 ENCOUNTER — Encounter: Payer: Self-pay | Admitting: Cardiology

## 2021-06-04 VITALS — BP 138/80 | HR 67 | Ht 73.0 in | Wt 241.8 lb

## 2021-06-04 DIAGNOSIS — I10 Essential (primary) hypertension: Secondary | ICD-10-CM | POA: Diagnosis not present

## 2021-06-04 DIAGNOSIS — I2510 Atherosclerotic heart disease of native coronary artery without angina pectoris: Secondary | ICD-10-CM | POA: Diagnosis not present

## 2021-06-04 NOTE — Patient Instructions (Signed)
Medication Instructions:  ?Continue all current medications. ? ?Labwork: ?none ? ?Testing/Procedures: ?none ? ?Follow-Up: ?September  ? ?Any Other Special Instructions Will Be Listed Below (If Applicable). ? ? ?If you need a refill on your cardiac medications before your next appointment, please call your pharmacy. ? ?
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
## 2021-09-15 ENCOUNTER — Encounter: Payer: Self-pay | Admitting: Internal Medicine

## 2021-09-23 ENCOUNTER — Ambulatory Visit (INDEPENDENT_AMBULATORY_CARE_PROVIDER_SITE_OTHER): Payer: Medicare Other | Admitting: Internal Medicine

## 2021-09-23 ENCOUNTER — Encounter: Payer: Self-pay | Admitting: Internal Medicine

## 2021-09-23 VITALS — BP 152/88 | HR 56 | Temp 97.8°F | Ht 73.0 in | Wt 233.0 lb

## 2021-09-23 DIAGNOSIS — I7121 Aneurysm of the ascending aorta, without rupture: Secondary | ICD-10-CM | POA: Diagnosis not present

## 2021-09-23 DIAGNOSIS — K219 Gastro-esophageal reflux disease without esophagitis: Secondary | ICD-10-CM | POA: Diagnosis not present

## 2021-09-23 DIAGNOSIS — Z1211 Encounter for screening for malignant neoplasm of colon: Secondary | ICD-10-CM | POA: Diagnosis not present

## 2021-09-23 DIAGNOSIS — Z79899 Other long term (current) drug therapy: Secondary | ICD-10-CM

## 2021-09-23 NOTE — Patient Instructions (Signed)
I agree that you need colonoscopy.  This will likely need to be done in October after you have been on dual antiplatelet therapy with aspirin and Brilinta for at least 1 year.  We will reach out to your cardiologist Dr. Harl Bowie to see what he thinks.  Once we have heard back from him we will call you to schedule your procedure.  Continue on omeprazole daily for your chronic reflux.  It was very nice meeting you today.  Dr. Abbey Chatters  Lifestyle and home remedies TO MANAGE REFLUX/HEARTBURN    You may eliminate or reduce the frequency of heartburn by making the following lifestyle changes:   Control your weight. Being overweight is a major risk factor for heartburn and GERD. Excess pounds put pressure on your abdomen, pushing up your stomach and causing acid to back up into your esophagus.    Eat smaller meals. 4 TO 6 MEALS A DAY. This reduces pressure on the lower esophageal sphincter, helping to prevent the valve from opening and acid from washing back into your esophagus.     Loosen your belt. Clothes that fit tightly around your waist put pressure on your abdomen and the lower esophageal sphincter.     Eliminate heartburn triggers. Everyone has specific triggers. Common triggers such as fatty or fried foods, spicy food, tomato sauce, carbonated beverages, alcohol, chocolate, mint, garlic, onion, caffeine and nicotine may make heartburn worse.    Avoid stooping or bending. Tying your shoes is OK. Bending over for longer periods to weed your garden isn't, especially soon after eating.    Don't lie down after a meal. Wait at least three to four hours after eating before going to bed, and don't lie down right after eating.
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
## 2021-10-22 ENCOUNTER — Ambulatory Visit: Payer: Medicare Other | Admitting: Internal Medicine

## 2021-11-02 ENCOUNTER — Encounter: Payer: Self-pay | Admitting: Cardiology

## 2021-11-02 ENCOUNTER — Ambulatory Visit: Payer: Medicare Other | Attending: Cardiology | Admitting: Cardiology

## 2021-11-02 VITALS — BP 118/90 | HR 59 | Ht 73.0 in | Wt 234.0 lb

## 2021-11-02 DIAGNOSIS — I2510 Atherosclerotic heart disease of native coronary artery without angina pectoris: Secondary | ICD-10-CM | POA: Insufficient documentation

## 2021-11-02 DIAGNOSIS — I10 Essential (primary) hypertension: Secondary | ICD-10-CM | POA: Insufficient documentation

## 2021-11-02 MED ORDER — TICAGRELOR 90 MG PO TABS: 90.0000 mg | ORAL_TABLET | Freq: Two times a day (BID) | ORAL | Status: DC
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
## 2021-11-02 NOTE — Patient Instructions (Signed)
Medication Instructions:  Stop Brilinta on 11/13/2021. Continue all other medications.     Labwork: none  Testing/Procedures: none  Follow-Up: 6 months   Any Other Special Instructions Will Be Listed Below (If Applicable).   If you need a refill on your cardiac medications before your next appointment, please call your pharmacy.

## 2021-11-03 ENCOUNTER — Encounter: Payer: Self-pay | Admitting: *Deleted

## 2021-11-05 ENCOUNTER — Telehealth: Payer: Self-pay | Admitting: Internal Medicine

## 2021-11-05 NOTE — Telephone Encounter (Signed)
Patient will be off his Brilinta completely 11/13/2021.  Can we schedule him for colonoscopy at least 5 days after that?  ASA 3.  Diagnosis adenomatous colon polyps.  Thank you

## 2021-11-05 NOTE — Telephone Encounter (Signed)
LMOVM to call back 

## 2021-11-11 ENCOUNTER — Encounter: Payer: Self-pay | Admitting: *Deleted

## 2021-11-11 MED ORDER — PEG 3350-KCL-NA BICARB-NACL 420 G PO SOLR: 4000.0000 mL | Freq: Once | ORAL | 0 refills | Status: AC

## 2021-11-11 NOTE — Addendum Note (Signed)
Addended by: Cheron Every on: 11/11/2021 10:17 AM   Modules accepted: Orders

## 2021-11-11 NOTE — Telephone Encounter (Signed)
Spoke with spouse. Pt scheduled for 10/11 at 8:30am. Aware will mail instructions/pre-op appt. Rx for prep sent to pharmacy.

## 2021-11-12 ENCOUNTER — Telehealth: Payer: Self-pay | Admitting: Cardiology

## 2021-11-12 MED ORDER — ATORVASTATIN CALCIUM 80 MG PO TABS: 80.0000 mg | ORAL_TABLET | Freq: Every day | ORAL | 3 refills | Status: DC

## 2021-11-12 NOTE — Telephone Encounter (Signed)
*  STAT* If patient is at the pharmacy, call can be transferred to refill team.   1. Which medications need to be refilled? (please list name of each medication and dose if known) atorvastatin (LIPITOR) 80 MG tablet  2. Which pharmacy/location (including street and city if local pharmacy) is medication to be sent to?  [place], [place] [address]  3. Do they need a 30 day or 90 day supply? [place]

## 2021-11-25 ENCOUNTER — Encounter (HOSPITAL_COMMUNITY): Payer: Self-pay

## 2021-11-25 NOTE — Patient Instructions (Signed)
   Your procedure is scheduled on: 12/02/2021  Report to [facility] at  7:00   AM.  Call this number if you have problems the morning of surgery: [phone]   Remember:              Follow Directions on the letter you received from Your Physician's office regarding the Bowel Prep              No Smoking the day of Procedure :   Take these medicines the morning of surgery with A SIP OF WATER: Omeprazole   Do not wear jewelry, make-up or nail polish.    Do not bring valuables to the hospital.  Contacts, dentures or bridgework may not be worn into surgery.  .   Patients discharged the day of surgery will not be allowed to drive home.     Colonoscopy, Adult, Care After This sheet gives you information about how to care for yourself after your procedure. Your health care provider may also give you more specific instructions. If you have problems or questions, contact your health care provider. What can I expect after the procedure? After the procedure, it is common to have: A small amount of blood in your stool for 24 hours after the procedure. Some gas. Mild abdominal cramping or bloating.  Follow these instructions at home: General instructions  For the first 24 hours after the procedure: Do not drive or use machinery. Do not sign important documents. Do not drink alcohol. Do your regular daily activities at a slower pace than normal. Eat soft, easy-to-digest foods. Rest often. Take over-the-counter or prescription medicines only as told by your health care provider. It is up to you to get the results of your procedure. Ask your health care provider, or the department performing the procedure, when your results will be ready. Relieving cramping and bloating Try walking around when you have cramps or feel bloated. Apply heat to your abdomen as told by your health care provider. Use a heat source that your health care provider recommends, such as a moist heat  pack or a heating pad. Place a towel between your skin and the heat source. Leave the heat on for 20-30 minutes. Remove the heat if your skin turns bright red. This is especially important if you are unable to feel pain, heat, or cold. You may have a greater risk of getting burned. Eating and drinking Drink enough fluid to keep your urine clear or pale yellow. Resume your normal diet as instructed by your health care provider. Avoid heavy or fried foods that are hard to digest. Avoid drinking alcohol for as long as instructed by your health care provider. Contact a health care provider if: You have blood in your stool 2-3 days after the procedure. Get help right away if: You have more than a small spotting of blood in your stool. You pass large blood clots in your stool. Your abdomen is swollen. You have nausea or vomiting. You have a fever. You have increasing abdominal pain that is not relieved with medicine. This information is not intended to replace advice given to you by your health care provider. Make sure you discuss any questions you have with your health care provider. Document Released: 09/23/2003 Document Revised: 11/03/2015 Document Reviewed: 04/22/2015 Elsevier Interactive Patient Education  [employer].

## 2021-11-30 ENCOUNTER — Encounter (HOSPITAL_COMMUNITY)
Admission: RE | Admit: 2021-11-30 | Discharge: 2021-11-30 | Disposition: A | Discharge status: Home / Self Care | Payer: Medicare Other | Source: Ambulatory Visit | Attending: Internal Medicine | Admitting: Internal Medicine

## 2021-11-30 ENCOUNTER — Encounter (HOSPITAL_COMMUNITY): Payer: Self-pay

## 2021-11-30 VITALS — Temp 97.9°F

## 2021-11-30 DIAGNOSIS — F1910 Other psychoactive substance abuse, uncomplicated: Secondary | ICD-10-CM

## 2021-11-30 DIAGNOSIS — Z01812 Encounter for preprocedural laboratory examination: Secondary | ICD-10-CM | POA: Diagnosis present

## 2021-11-30 DIAGNOSIS — F1410 Cocaine abuse, uncomplicated: Secondary | ICD-10-CM | POA: Diagnosis not present

## 2021-11-30 DIAGNOSIS — R826 Abnormal urine levels of substances chiefly nonmedicinal as to source: Secondary | ICD-10-CM | POA: Insufficient documentation

## 2021-11-30 LAB — RAPID URINE DRUG SCREEN, HOSP PERFORMED
Amphetamines: NOT DETECTED
Barbiturates: NOT DETECTED
Benzodiazepines: NOT DETECTED
Cocaine: POSITIVE — AB
Opiates: NOT DETECTED
Tetrahydrocannabinol: POSITIVE — AB

## 2021-11-30 NOTE — Progress Notes (Signed)
Patient tested positive for cocaine.  Colonoscopy will be cancelled. Left a voicemail with the patient after several attempts of reaching him by phone.

## 2021-12-01 ENCOUNTER — Telehealth: Payer: Self-pay | Admitting: *Deleted

## 2021-12-01 ENCOUNTER — Telehealth: Payer: Self-pay | Admitting: Cardiology

## 2021-12-01 NOTE — Telephone Encounter (Signed)
Advised that his GI doctor will advise when its safe to restart brilinta Verbalized understanding

## 2021-12-01 NOTE — Telephone Encounter (Signed)
-----   Message from Jacqulynn Cadet, RN sent at 11/30/2021  4:45 PM EDT ----- Regarding: Cancellation Patient has to be cancelled for now. Cocaine positive

## 2021-12-01 NOTE — Telephone Encounter (Signed)
Pt c/o medication issue:  1. Name of Medication: Brilinta  2. How are you currently taking this medication (dosage and times per day)?  2 times a day  3. Are you having a reaction (difficulty breathing--STAT)?   4. What is your medication issue? Had to stop Brilinta for procedure for tomorrow- wants to know when can he start back on it?

## 2021-12-01 NOTE — Telephone Encounter (Signed)
Fowarding to Dr. Abbey Chatters.

## 2021-12-02 ENCOUNTER — Encounter (HOSPITAL_COMMUNITY)
Admission: RE | Disposition: A | Discharge status: Home / Self Care | Payer: Self-pay | Source: Home / Self Care | Attending: Internal Medicine

## 2021-12-02 ENCOUNTER — Encounter (HOSPITAL_COMMUNITY): Payer: Self-pay | Admitting: Anesthesiology

## 2021-12-02 ENCOUNTER — Ambulatory Visit (HOSPITAL_COMMUNITY)
Admission: RE | Admit: 2021-12-02 | Discharge: 2021-12-02 | Disposition: A | Discharge status: Home / Self Care | Payer: Medicare Other | Attending: Internal Medicine | Admitting: Internal Medicine

## 2021-12-02 ENCOUNTER — Encounter: Payer: Self-pay | Admitting: *Deleted

## 2021-12-02 SURGERY — COLONOSCOPY WITH PROPOFOL
Anesthesia: Monitor Anesthesia Care

## 2021-12-02 MED ORDER — PEG 3350-KCL-NA BICARB-NACL 420 G PO SOLR: 4000.0000 mL | Freq: Once | ORAL | 0 refills | Status: AC

## 2021-12-02 NOTE — H&P (Signed)
Patient presented for colonoscopy.  However positive UDS for cocaine, anesthesia has recommended rescheduling.  Seizure not performed today.

## 2021-12-02 NOTE — OR Nursing (Signed)
Patient arrived this morning for TCS after he was cancelled because of cocaine use. After anesthesia spoke with patient he decided it was ok to proceed.  Patient was registered brought to preop and Anesthesoiogist  decsion to cancel after reviewing cardiac note.

## 2021-12-04 ENCOUNTER — Encounter: Payer: Self-pay | Admitting: *Deleted

## 2021-12-04 ENCOUNTER — Telehealth: Payer: Self-pay | Admitting: *Deleted

## 2021-12-04 NOTE — Telephone Encounter (Signed)
error 

## 2021-12-23 NOTE — Patient Instructions (Signed)
Matthew Dickerson  12/23/2021     '@PREFPERIOPPHARMACY'$ @   Your procedure is scheduled on  12/28/2021.   Report to [facility] at  [place].M.   Call this number if you have problems the morning of surgery:  [phone]  If you experience any cold or flu symptoms such as cough, fever, chills, shortness of breath, etc. between now and your scheduled surgery, please notify us at the above number.   Remember:  Follow the diet and prep instructions given to you by the office.     Take these medicines the morning of surgery with A SIP OF WATER                                                      prilosec.     Do not wear jewelry, make-up or nail polish.  Do not wear lotions, powders, or perfumes, or deodorant.  Do not shave 48 hours prior to surgery.  Men may shave face and neck.  Do not bring valuables to the hospital.  [facility] is not responsible for any belongings or valuables.  Contacts, dentures or bridgework may not be worn into surgery.  Leave your suitcase in the car.  After surgery it may be brought to your room.  For patients admitted to the hospital, discharge time will be determined by your treatment team.  Patients discharged the day of surgery will not be allowed to drive home and must have someone with them for 24 hours.    Special instructions:   DO NOT smoke tobacco or vape for 24 hours before your procedure.  Please read over the following fact sheets that you were given. Anesthesia Post-op Instructions and Care and Recovery After Surgery      Colonoscopy, Adult, Care After The following information offers guidance on how to care for yourself after your procedure. Your health care provider may also give you more specific instructions. If you have problems or questions, contact your health care provider. What can I expect after the procedure? After the procedure, it is common to have: A small amount of blood in your stool for 24 hours after the  procedure. Some gas. Mild cramping or bloating of your abdomen. Follow these instructions at home: Eating and drinking  Drink enough fluid to keep your urine pale yellow. Follow instructions from your health care provider about eating or drinking restrictions. Resume your normal diet as told by your health care provider. Avoid heavy or fried foods that are hard to digest. Activity Rest as told by your health care provider. Avoid sitting for a long time without moving. Get up to take short walks every 1-2 hours. This is important to improve blood flow and breathing. Ask for help if you feel weak or unsteady. Return to your normal activities as told by your health care provider. Ask your health care provider what activities are safe for you. Managing cramping and bloating  Try walking around when you have cramps or feel bloated. If directed, apply heat to your abdomen as told by your health care provider. Use the heat source that your health care provider recommends, such as a moist heat pack or a heating pad. Place a towel between your skin and the heat source. Leave the heat on for 20-30 minutes.  Remove the heat if your skin turns bright red. This is especially important if you are unable to feel pain, heat, or cold. You have a greater risk of getting burned. General instructions If you were given a sedative during the procedure, it can affect you for several hours. Do not drive or operate machinery until your health care provider says that it is safe. For the first 24 hours after the procedure: Do not sign important documents. Do not drink alcohol. Do your regular daily activities at a slower pace than normal. Eat soft foods that are easy to digest. Take over-the-counter and prescription medicines only as told by your health care provider. Keep all follow-up visits. This is important. Contact a health care provider if: You have blood in your stool 2-3 days after the procedure. Get  help right away if: You have more than a small spotting of blood in your stool. You have large blood clots in your stool. You have swelling of your abdomen. You have nausea or vomiting. You have a fever. You have increasing pain in your abdomen that is not relieved with medicine. These symptoms may be an emergency. Get help right away. Call 911. Do not wait to see if the symptoms will go away. Do not drive yourself to the hospital. Summary After the procedure, it is common to have a small amount of blood in your stool. You may also have mild cramping and bloating of your abdomen. If you were given a sedative during the procedure, it can affect you for several hours. Do not drive or operate machinery until your health care provider says that it is safe. Get help right away if you have a lot of blood in your stool, nausea or vomiting, a fever, or increased pain in your abdomen. This information is not intended to replace advice given to you by your health care provider. Make sure you discuss any questions you have with your health care provider. Document Revised: 10/01/2020 Document Reviewed: 10/01/2020 Elsevier Patient Education  [employer] After The following information offers guidance on how to care for yourself after your procedure. Your health care provider may also give you more specific instructions. If you have problems or questions, contact your health care provider. What can I expect after the procedure? After the procedure, it is common to have: Tiredness. Little or no memory about what happened during or after the procedure. Impaired judgment when it comes to making decisions. Nausea or vomiting. Some trouble with balance. Follow these instructions at home: For the time period you were told by your health care provider:  Rest. Do not participate in activities where you could fall or become injured. Do not drive or use machinery. Do  not drink alcohol. Do not take sleeping pills or medicines that cause drowsiness. Do not make important decisions or sign legal documents. Do not take care of children on your own. Medicines Take over-the-counter and prescription medicines only as told by your health care provider. If you were prescribed antibiotics, take them as told by your health care provider. Do not stop using the antibiotic even if you start to feel better. Eating and drinking Follow instructions from your health care provider about what you may eat and drink. Drink enough fluid to keep your urine pale yellow. If you vomit: Drink clear fluids slowly and in small amounts as you are able. Clear fluids include water, ice chips, low-calorie sports drinks, and fruit juice that has water  added to it (diluted fruit juice). Eat light and bland foods in small amounts as you are able. These foods include bananas, applesauce, rice, lean meats, toast, and crackers. General instructions  Have a responsible adult stay with you for the time you are told. It is important to have someone help care for you until you are awake and alert. If you have sleep apnea, surgery and some medicines can increase your risk for breathing problems. Follow instructions from your health care provider about wearing your sleep device: When you are sleeping. This includes during daytime naps. While taking prescription pain medicines, sleeping medicines, or medicines that make you drowsy. Do not use any products that contain nicotine or tobacco. These products include cigarettes, chewing tobacco, and vaping devices, such as e-cigarettes. If you need help quitting, ask your health care provider. Contact a health care provider if: You feel nauseous or vomit every time you eat or drink. You feel light-headed. You are still sleepy or having trouble with balance after 24 hours. You get a rash. You have a fever. You have redness or swelling around the IV  site. Get help right away if: You have trouble breathing. You have new confusion after you get home. These symptoms may be an emergency. Get help right away. Call 911. Do not wait to see if the symptoms will go away. Do not drive yourself to the hospital. This information is not intended to replace advice given to you by your health care provider. Make sure you discuss any questions you have with your health care provider. Document Revised: 07/06/2021 Document Reviewed: 07/06/2021 Elsevier Patient Education  [employer].

## 2021-12-24 ENCOUNTER — Other Ambulatory Visit: Payer: Self-pay

## 2021-12-24 ENCOUNTER — Encounter (HOSPITAL_COMMUNITY)
Admission: RE | Admit: 2021-12-24 | Discharge: 2021-12-24 | Disposition: A | Discharge status: Home / Self Care | Payer: Medicare Other | Source: Ambulatory Visit | Attending: Internal Medicine | Admitting: Internal Medicine

## 2021-12-24 ENCOUNTER — Other Ambulatory Visit (HOSPITAL_COMMUNITY): Payer: Medicare Other

## 2021-12-24 VITALS — Temp 98.0°F | Ht 73.0 in

## 2021-12-24 DIAGNOSIS — F1410 Cocaine abuse, uncomplicated: Secondary | ICD-10-CM | POA: Diagnosis not present

## 2021-12-24 DIAGNOSIS — Z01812 Encounter for preprocedural laboratory examination: Secondary | ICD-10-CM | POA: Diagnosis present

## 2021-12-24 LAB — RAPID URINE DRUG SCREEN, HOSP PERFORMED
Amphetamines: NOT DETECTED
Barbiturates: NOT DETECTED
Benzodiazepines: NOT DETECTED
Cocaine: POSITIVE — AB
Opiates: NOT DETECTED
Tetrahydrocannabinol: POSITIVE — AB

## 2021-12-24 NOTE — Progress Notes (Signed)
Pt arrived to PAT interview. Discussed information for procedure. Pt verbalized understanding.

## 2021-12-25 ENCOUNTER — Telehealth: Payer: Self-pay | Admitting: *Deleted

## 2021-12-25 ENCOUNTER — Encounter (HOSPITAL_COMMUNITY): Payer: Self-pay | Admitting: Anesthesiology

## 2021-12-25 NOTE — Telephone Encounter (Signed)
Received message from endo procedure cancelled for 11/6. Tested positive for cocaine again. This is #2. Please advise Dr. Abbey Chatters. Thanks!

## 2021-12-25 NOTE — Pre-Procedure Instructions (Addendum)
Urine drug screen positive for cocaine. Mindy notified that case will be cancelled. She stated that she would notify Dr. Abbey Chatters. Called pt and left VM to return call. Daughter called to notify pt to call me back. No information given to daughter over the phone.

## 2021-12-28 ENCOUNTER — Ambulatory Visit (HOSPITAL_COMMUNITY): Admission: RE | Admit: 2021-12-28 | Payer: Medicare Other | Source: Home / Self Care

## 2021-12-28 ENCOUNTER — Other Ambulatory Visit: Payer: Self-pay | Admitting: *Deleted

## 2021-12-28 ENCOUNTER — Encounter (HOSPITAL_COMMUNITY): Admission: RE | Payer: Self-pay | Source: Home / Self Care

## 2021-12-28 SURGERY — COLONOSCOPY WITH PROPOFOL
Anesthesia: Monitor Anesthesia Care

## 2021-12-30 NOTE — Telephone Encounter (Signed)
Needs OV with me

## 2021-12-31 NOTE — Telephone Encounter (Signed)
Okay to use [facility] thank you

## 2022-01-11 ENCOUNTER — Telehealth: Payer: Self-pay | Admitting: Cardiology

## 2022-01-11 NOTE — Telephone Encounter (Signed)
Wife called stating that patient has been having tingling in his left arm, radiating towards his neck. Been going on at least 2-3 times a week for the past 2-3 weeks. Wife states that she was just notified yesterday. Patient denies chest pain, dizziness or SOB. States that patient has been off of the Brilinta for the past 2 months and that his recent lab work in November was positive for cocaine. States that she will get more information once the patient returns home. Please advise

## 2022-01-11 NOTE — Telephone Encounter (Signed)
Follow up:    Patient wife called. She says she need to talk to the nurse again. She have some more information that at she needs to give her please.

## 2022-01-11 NOTE — Telephone Encounter (Signed)
Patient's wife says the patient has been having tingling in his left arm and sometimes his neck. She says he has no pain and no other symptoms.

## 2022-01-11 NOTE — Telephone Encounter (Signed)
Wife made aware, verbalized understanding.

## 2022-01-11 NOTE — Telephone Encounter (Signed)
Wife called back stating that she was able to get more information. Tingling has been going on for 5 weeks and radiates in left cheek, neck and shoulder and has become more constant. States that he fell off of his trailer about 4-5 weeks ago and bruised his vertebrae and thought maybe this was the cause but is unsure. Please advise.

## 2022-01-11 NOTE — Telephone Encounter (Signed)
Needs pcp evaluation, symptoms are not specifically cardiac  Matthew Abts MD

## 2022-01-18 ENCOUNTER — Other Ambulatory Visit (HOSPITAL_COMMUNITY): Payer: Self-pay | Admitting: Internal Medicine

## 2022-01-18 ENCOUNTER — Ambulatory Visit (HOSPITAL_COMMUNITY)
Admission: RE | Admit: 2022-01-18 | Discharge: 2022-01-18 | Disposition: A | Discharge status: Home / Self Care | Payer: Medicare Other | Source: Ambulatory Visit | Attending: Internal Medicine | Admitting: Internal Medicine

## 2022-01-18 DIAGNOSIS — M545 Low back pain, unspecified: Secondary | ICD-10-CM

## 2022-01-19 ENCOUNTER — Other Ambulatory Visit (HOSPITAL_COMMUNITY): Payer: Self-pay | Admitting: Family Medicine

## 2022-01-19 DIAGNOSIS — M545 Low back pain, unspecified: Secondary | ICD-10-CM

## 2022-01-19 DIAGNOSIS — R202 Paresthesia of skin: Secondary | ICD-10-CM

## 2022-01-20 ENCOUNTER — Encounter: Payer: Self-pay | Admitting: Internal Medicine

## 2022-01-20 ENCOUNTER — Encounter: Payer: Self-pay | Admitting: *Deleted

## 2022-01-20 ENCOUNTER — Ambulatory Visit (INDEPENDENT_AMBULATORY_CARE_PROVIDER_SITE_OTHER): Payer: Medicare Other | Admitting: Internal Medicine

## 2022-01-20 VITALS — BP 137/89 | HR 82 | Temp 97.5°F | Ht 73.0 in | Wt 238.2 lb

## 2022-01-20 DIAGNOSIS — Z1211 Encounter for screening for malignant neoplasm of colon: Secondary | ICD-10-CM

## 2022-01-20 DIAGNOSIS — K219 Gastro-esophageal reflux disease without esophagitis: Secondary | ICD-10-CM | POA: Diagnosis not present
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
## 2022-01-20 NOTE — Patient Instructions (Signed)
We will reschedule your colonoscopy given your history of polyps prior.  Further recommendations to follow.  It was good seeing you again today.  Dr. Abbey Chatters

## 2022-01-21 ENCOUNTER — Encounter (INDEPENDENT_AMBULATORY_CARE_PROVIDER_SITE_OTHER): Payer: Self-pay | Admitting: *Deleted

## 2022-01-21 ENCOUNTER — Ambulatory Visit (HOSPITAL_COMMUNITY)
Admission: RE | Admit: 2022-01-21 | Discharge: 2022-01-21 | Disposition: A | Discharge status: Home / Self Care | Payer: Medicare Other | Source: Ambulatory Visit | Attending: Family Medicine | Admitting: Family Medicine

## 2022-01-21 DIAGNOSIS — R202 Paresthesia of skin: Secondary | ICD-10-CM | POA: Insufficient documentation

## 2022-01-21 DIAGNOSIS — M545 Low back pain, unspecified: Secondary | ICD-10-CM | POA: Insufficient documentation

## 2022-01-27 ENCOUNTER — Other Ambulatory Visit (HOSPITAL_COMMUNITY): Payer: Self-pay | Admitting: Family Medicine

## 2022-01-27 DIAGNOSIS — R590 Localized enlarged lymph nodes: Secondary | ICD-10-CM

## 2022-01-27 NOTE — Patient Instructions (Signed)
CHRIST FULLENWIDER  01/27/2022     '@PREFPERIOPPHARMACY'$ @   Your procedure is scheduled on 02/01/2022.   Report to [facility] at  1100  A.M.   Call this number if you have problems the morning of surgery:  [phone]  If you experience any cold or flu symptoms such as cough, fever, chills, shortness of breath, etc. between now and your scheduled surgery, please notify us at the above number.   Remember:  Follow the diet and prep instructions given to you by the office.      Your last dose of brilinta should be on 12/6.     Take these medicines the morning of surgery with A SIP OF WATER                                         omeprazole.     Do not wear jewelry, make-up or nail polish.  Do not wear lotions, powders, or perfumes, or deodorant.  Do not shave 48 hours prior to surgery.  Men may shave face and neck.  Do not bring valuables to the hospital.  [facility] is not responsible for any belongings or valuables.  Contacts, dentures or bridgework may not be worn into surgery.  Leave your suitcase in the car.  After surgery it may be brought to your room.  For patients admitted to the hospital, discharge time will be determined by your treatment team.  Patients discharged the day of surgery will not be allowed to drive home and must have someone with them for 24 hours.    Special instructions:   DO NOT smoke tobacco or vape for 24 hours before your procedure.  Please read over the following fact sheets that you were given. Anesthesia Post-op Instructions and Care and Recovery After Surgery      Colonoscopy, Adult, Care After The following information offers guidance on how to care for yourself after your procedure. Your health care provider may also give you more specific instructions. If you have problems or questions, contact your health care provider. What can I expect after the procedure? After the procedure, it is common to have: A small amount of blood  in your stool for 24 hours after the procedure. Some gas. Mild cramping or bloating of your abdomen. Follow these instructions at home: Eating and drinking  Drink enough fluid to keep your urine pale yellow. Follow instructions from your health care provider about eating or drinking restrictions. Resume your normal diet as told by your health care provider. Avoid heavy or fried foods that are hard to digest. Activity Rest as told by your health care provider. Avoid sitting for a long time without moving. Get up to take short walks every 1-2 hours. This is important to improve blood flow and breathing. Ask for help if you feel weak or unsteady. Return to your normal activities as told by your health care provider. Ask your health care provider what activities are safe for you. Managing cramping and bloating  Try walking around when you have cramps or feel bloated. If directed, apply heat to your abdomen as told by your health care provider. Use the heat source that your health care provider recommends, such as a moist heat pack or a heating pad. Place a towel between your skin and the heat source. Leave the heat on for 20-30 minutes.  Remove the heat if your skin turns bright red. This is especially important if you are unable to feel pain, heat, or cold. You have a greater risk of getting burned. General instructions If you were given a sedative during the procedure, it can affect you for several hours. Do not drive or operate machinery until your health care provider says that it is safe. For the first 24 hours after the procedure: Do not sign important documents. Do not drink alcohol. Do your regular daily activities at a slower pace than normal. Eat soft foods that are easy to digest. Take over-the-counter and prescription medicines only as told by your health care provider. Keep all follow-up visits. This is important. Contact a health care provider if: You have blood in your stool  2-3 days after the procedure. Get help right away if: You have more than a small spotting of blood in your stool. You have large blood clots in your stool. You have swelling of your abdomen. You have nausea or vomiting. You have a fever. You have increasing pain in your abdomen that is not relieved with medicine. These symptoms may be an emergency. Get help right away. Call 911. Do not wait to see if the symptoms will go away. Do not drive yourself to the hospital. Summary After the procedure, it is common to have a small amount of blood in your stool. You may also have mild cramping and bloating of your abdomen. If you were given a sedative during the procedure, it can affect you for several hours. Do not drive or operate machinery until your health care provider says that it is safe. Get help right away if you have a lot of blood in your stool, nausea or vomiting, a fever, or increased pain in your abdomen. This information is not intended to replace advice given to you by your health care provider. Make sure you discuss any questions you have with your health care provider. Document Revised: 10/01/2020 Document Reviewed: 10/01/2020 Elsevier Patient Education  [place] After The following information offers guidance on how to care for yourself after your procedure. Your health care provider may also give you more specific instructions. If you have problems or questions, contact your health care provider. What can I expect after the procedure? After the procedure, it is common to have: Tiredness. Little or no memory about what happened during or after the procedure. Impaired judgment when it comes to making decisions. Nausea or vomiting. Some trouble with balance. Follow these instructions at home: For the time period you were told by your health care provider:  Rest. Do not participate in activities where you could fall or become  injured. Do not drive or use machinery. Do not drink alcohol. Do not take sleeping pills or medicines that cause drowsiness. Do not make important decisions or sign legal documents. Do not take care of children on your own. Medicines Take over-the-counter and prescription medicines only as told by your health care provider. If you were prescribed antibiotics, take them as told by your health care provider. Do not stop using the antibiotic even if you start to feel better. Eating and drinking Follow instructions from your health care provider about what you may eat and drink. Drink enough fluid to keep your urine pale yellow. If you vomit: Drink clear fluids slowly and in small amounts as you are able. Clear fluids include water, ice chips, low-calorie sports drinks, and fruit juice that has water  added to it (diluted fruit juice). Eat light and bland foods in small amounts as you are able. These foods include bananas, applesauce, rice, lean meats, toast, and crackers. General instructions  Have a responsible adult stay with you for the time you are told. It is important to have someone help care for you until you are awake and alert. If you have sleep apnea, surgery and some medicines can increase your risk for breathing problems. Follow instructions from your health care provider about wearing your sleep device: When you are sleeping. This includes during daytime naps. While taking prescription pain medicines, sleeping medicines, or medicines that make you drowsy. Do not use any products that contain nicotine or tobacco. These products include cigarettes, chewing tobacco, and vaping devices, such as e-cigarettes. If you need help quitting, ask your health care provider. Contact a health care provider if: You feel nauseous or vomit every time you eat or drink. You feel light-headed. You are still sleepy or having trouble with balance after 24 hours. You get a rash. You have a fever. You  have redness or swelling around the IV site. Get help right away if: You have trouble breathing. You have new confusion after you get home. These symptoms may be an emergency. Get help right away. Call 911. Do not wait to see if the symptoms will go away. Do not drive yourself to the hospital. This information is not intended to replace advice given to you by your health care provider. Make sure you discuss any questions you have with your health care provider. Document Revised: 07/06/2021 Document Reviewed: 07/06/2021 Elsevier Patient Education  [place].

## 2022-01-28 ENCOUNTER — Encounter (HOSPITAL_COMMUNITY): Payer: Self-pay

## 2022-01-28 ENCOUNTER — Encounter (HOSPITAL_COMMUNITY)
Admission: RE | Admit: 2022-01-28 | Discharge: 2022-01-28 | Disposition: A | Discharge status: Home / Self Care | Payer: Medicare Other | Source: Ambulatory Visit | Attending: Internal Medicine | Admitting: Internal Medicine

## 2022-01-28 VITALS — BP 157/96 | HR 105 | Temp 97.5°F | Resp 18 | Ht 73.0 in | Wt 238.2 lb

## 2022-01-28 DIAGNOSIS — F1410 Cocaine abuse, uncomplicated: Secondary | ICD-10-CM | POA: Diagnosis not present

## 2022-01-28 DIAGNOSIS — Z01812 Encounter for preprocedural laboratory examination: Secondary | ICD-10-CM | POA: Diagnosis not present

## 2022-01-28 LAB — RAPID URINE DRUG SCREEN, HOSP PERFORMED
Amphetamines: NOT DETECTED
Barbiturates: NOT DETECTED
Benzodiazepines: NOT DETECTED
Cocaine: NOT DETECTED
Opiates: NOT DETECTED
Tetrahydrocannabinol: POSITIVE — AB

## 2022-01-29 ENCOUNTER — Encounter: Payer: Self-pay | Admitting: *Deleted

## 2022-01-29 ENCOUNTER — Telehealth: Payer: Self-pay | Admitting: *Deleted

## 2022-01-29 NOTE — Telephone Encounter (Signed)
Pt's wife called and said he hadn't received his instructions for the prep and his procedure is on 02/01/22. Instructions sent to pt via MyChart.

## 2022-02-01 ENCOUNTER — Other Ambulatory Visit: Payer: Self-pay

## 2022-02-01 ENCOUNTER — Ambulatory Visit (HOSPITAL_COMMUNITY)
Admission: RE | Admit: 2022-02-01 | Discharge: 2022-02-01 | Disposition: A | Discharge status: Home / Self Care | Payer: Medicare Other | Attending: Internal Medicine | Admitting: Internal Medicine

## 2022-02-01 ENCOUNTER — Ambulatory Visit (HOSPITAL_COMMUNITY): Payer: Medicare Other | Admitting: Anesthesiology

## 2022-02-01 ENCOUNTER — Encounter (HOSPITAL_COMMUNITY): Payer: Self-pay

## 2022-02-01 ENCOUNTER — Ambulatory Visit (HOSPITAL_BASED_OUTPATIENT_CLINIC_OR_DEPARTMENT_OTHER): Payer: Medicare Other | Admitting: Anesthesiology

## 2022-02-01 ENCOUNTER — Encounter (HOSPITAL_COMMUNITY)
Admission: RE | Disposition: A | Discharge status: Home / Self Care | Payer: Self-pay | Source: Home / Self Care | Attending: Internal Medicine

## 2022-02-01 DIAGNOSIS — I7121 Aneurysm of the ascending aorta, without rupture: Secondary | ICD-10-CM | POA: Insufficient documentation

## 2022-02-01 DIAGNOSIS — Z800 Family history of malignant neoplasm of digestive organs: Secondary | ICD-10-CM | POA: Insufficient documentation

## 2022-02-01 DIAGNOSIS — F17210 Nicotine dependence, cigarettes, uncomplicated: Secondary | ICD-10-CM | POA: Diagnosis not present

## 2022-02-01 DIAGNOSIS — Z955 Presence of coronary angioplasty implant and graft: Secondary | ICD-10-CM | POA: Diagnosis not present

## 2022-02-01 DIAGNOSIS — D124 Benign neoplasm of descending colon: Secondary | ICD-10-CM

## 2022-02-01 DIAGNOSIS — I10 Essential (primary) hypertension: Secondary | ICD-10-CM | POA: Diagnosis not present

## 2022-02-01 DIAGNOSIS — I739 Peripheral vascular disease, unspecified: Secondary | ICD-10-CM | POA: Insufficient documentation

## 2022-02-01 DIAGNOSIS — Z1211 Encounter for screening for malignant neoplasm of colon: Secondary | ICD-10-CM | POA: Diagnosis not present

## 2022-02-01 DIAGNOSIS — I252 Old myocardial infarction: Secondary | ICD-10-CM | POA: Insufficient documentation

## 2022-02-01 DIAGNOSIS — Z7982 Long term (current) use of aspirin: Secondary | ICD-10-CM | POA: Diagnosis not present

## 2022-02-01 DIAGNOSIS — Z86010 Personal history of colonic polyps: Secondary | ICD-10-CM | POA: Insufficient documentation

## 2022-02-01 DIAGNOSIS — K5730 Diverticulosis of large intestine without perforation or abscess without bleeding: Secondary | ICD-10-CM | POA: Insufficient documentation

## 2022-02-01 DIAGNOSIS — D122 Benign neoplasm of ascending colon: Secondary | ICD-10-CM

## 2022-02-01 DIAGNOSIS — K219 Gastro-esophageal reflux disease without esophagitis: Secondary | ICD-10-CM | POA: Insufficient documentation

## 2022-02-01 DIAGNOSIS — I2510 Atherosclerotic heart disease of native coronary artery without angina pectoris: Secondary | ICD-10-CM | POA: Insufficient documentation

## 2022-02-01 DIAGNOSIS — K648 Other hemorrhoids: Secondary | ICD-10-CM | POA: Insufficient documentation

## 2022-02-01 DIAGNOSIS — D125 Benign neoplasm of sigmoid colon: Secondary | ICD-10-CM | POA: Diagnosis not present

## 2022-02-01 DIAGNOSIS — D123 Benign neoplasm of transverse colon: Secondary | ICD-10-CM | POA: Diagnosis not present

## 2022-02-01 DIAGNOSIS — Z1212 Encounter for screening for malignant neoplasm of rectum: Secondary | ICD-10-CM | POA: Diagnosis not present

## 2022-02-01 HISTORY — PX: COLONOSCOPY WITH PROPOFOL: SHX5780

## 2022-02-01 HISTORY — PX: POLYPECTOMY: SHX149

## 2022-02-01 SURGERY — COLONOSCOPY WITH PROPOFOL
Anesthesia: General

## 2022-02-01 MED ORDER — LIDOCAINE HCL (CARDIAC) PF 100 MG/5ML IV SOSY
PREFILLED_SYRINGE | INTRAVENOUS | Status: DC | PRN
  Administered 2022-02-01: 50 mg via INTRATRACHEAL

## 2022-02-01 MED ORDER — PROPOFOL 10 MG/ML IV BOLUS
INTRAVENOUS | Status: DC | PRN
  Administered 2022-02-01: 100 mg via INTRAVENOUS

## 2022-02-01 MED ORDER — PROPOFOL 500 MG/50ML IV EMUL
INTRAVENOUS | Status: DC | PRN
  Administered 2022-02-01: 200 ug/kg/min via INTRAVENOUS

## 2022-02-01 MED ORDER — STERILE WATER FOR IRRIGATION IR SOLN
Status: DC | PRN
  Administered 2022-02-01 (×2): 60 mL

## 2022-02-01 MED ORDER — LACTATED RINGERS IV SOLN: INTRAVENOUS | Status: DC

## 2022-02-01 NOTE — Discharge Instructions (Addendum)
  Colonoscopy Discharge Instructions  Read the instructions outlined below and refer to this sheet in the next few weeks. These discharge instructions provide you with general information on caring for yourself after you leave the hospital. Your doctor may also give you specific instructions. While your treatment has been planned according to the most current medical practices available, unavoidable complications occasionally occur.   ACTIVITY You may resume your regular activity, but move at a slower pace for the next 24 hours.  Take frequent rest periods for the next 24 hours.  Walking will help get rid of the air and reduce the bloated feeling in your belly (abdomen).  No driving for 24 hours (because of the medicine (anesthesia) used during the test).   Do not sign any important legal documents or operate any machinery for 24 hours (because of the anesthesia used during the test).  NUTRITION Drink plenty of fluids.  You may resume your normal diet as instructed by your doctor.  Begin with a light meal and progress to your normal diet. Heavy or fried foods are harder to digest and may make you feel sick to your stomach (nauseated).  Avoid alcoholic beverages for 24 hours or as instructed.  MEDICATIONS You may resume your normal medications unless your doctor tells you otherwise.  WHAT YOU CAN EXPECT TODAY Some feelings of bloating in the abdomen.  Passage of more gas than usual.  Spotting of blood in your stool or on the toilet paper.  IF YOU HAD POLYPS REMOVED DURING THE COLONOSCOPY: No aspirin products for 7 days or as instructed.  No alcohol for 7 days or as instructed.  Eat a soft diet for the next 24 hours.  FINDING OUT THE RESULTS OF YOUR TEST Not all test results are available during your visit. If your test results are not back during the visit, make an appointment with your caregiver to find out the results. Do not assume everything is normal if you have not heard from your  caregiver or the medical facility. It is important for you to follow up on all of your test results.  SEEK IMMEDIATE MEDICAL ATTENTION IF: You have more than a spotting of blood in your stool.  Your belly is swollen (abdominal distention).  You are nauseated or vomiting.  You have a temperature over 101.  You have abdominal pain or discomfort that is severe or gets worse throughout the day.   Your colonoscopy revealed 8 polyp(s) which I removed successfully. Await pathology results, my office will contact you. I recommend repeating colonoscopy in 3 years for surveillance purposes. Otherwise follow up with GI as needed.    I hope you have a great rest of your week!  Elon Alas. Abbey Chatters, D.O. Gastroenterology and Hepatology [facility]

## 2022-02-01 NOTE — Op Note (Signed)
[facility] Patient Name: Matthew Dickerson Procedure Date: 02/01/2022 10:47 AM MRN: [id number] Date of Birth: 04-26-1955 Attending MD: Elon Alas. Abbey Chatters , [place], [id number] CSN: [id number] Age: 66 Admit Type: Outpatient Procedure:                Colonoscopy Indications:              Screening for colorectal malignant neoplasm Providers:                Elon Alas. Abbey Chatters, DO, Crystal Page, Raphael Gibney,                            Technician Referring MD:              Medicines:                See the Anesthesia note for documentation of the                            administered medications Complications:            No immediate complications. Estimated Blood Loss:     Estimated blood loss was minimal. Procedure:                Pre-Anesthesia Assessment:                           - The anesthesia plan was to use monitored                            anesthesia care (MAC).                           After obtaining informed consent, the colonoscope                            was passed under direct vision. Throughout the                            procedure, the patient's blood pressure, pulse, and                            oxygen saturations were monitored continuously. The                            [id number]-HQ190L ([id number]) scope was introduced through                            the anus and advanced to the the cecum, identified                            by appendiceal orifice and ileocecal valve. The                            colonoscopy was technically difficult and complex                            due to a redundant colon and  significant looping.                            Successful completion of the procedure was aided by                            applying abdominal pressure. The patient tolerated                            the procedure well. The quality of the bowel                            preparation was evaluated using the BBPS [facility]                            Bowel  Preparation Scale) with scores of: Right                            Colon = 3, Transverse Colon = 3 and Left Colon = 3                            (entire mucosa seen well with no residual staining,                            small fragments of stool or opaque liquid). The                            total BBPS score equals 9. Scope In: 10:52:46 AM Scope Out: 11:17:44 AM Scope Withdrawal Time: 0 hours 20 minutes 32 seconds  Total Procedure Duration: 0 hours 24 minutes 58 seconds  Findings:      The perianal and digital rectal examinations were normal.      Non-bleeding internal hemorrhoids were found during endoscopy.      Multiple small-mouthed diverticula were found in the sigmoid colon.      Three sessile polyps were found in the ascending colon. The polyps were       5 to 10 mm in size. These polyps were removed with a cold snare.       Resection and retrieval were complete.      A 2 mm polyp was found in the ascending colon. The polyp was sessile.       The polyp was removed with a cold biopsy forceps. Resection and       retrieval were complete.      Four sessile polyps were found in the descending colon and transverse       colon. The polyps were 5 to 8 mm in size. These polyps were removed with       a cold snare. Resection and retrieval were complete.      The exam was otherwise without abnormality. Impression:               - Non-bleeding internal hemorrhoids.                           - Diverticulosis in the sigmoid colon.                           -  Three 5 to 10 mm polyps in the ascending colon,                            removed with a cold snare. Resected and retrieved.                           - One 2 mm polyp in the ascending colon, removed                            with a cold biopsy forceps. Resected and retrieved.                           - Four 5 to 8 mm polyps in the descending colon and                            in the transverse colon, removed with a cold  snare.                            Resected and retrieved.                           - The examination was otherwise normal. Moderate Sedation:      Per Anesthesia Care Recommendation:           - Patient has a contact number available for                            emergencies. The signs and symptoms of potential                            delayed complications were discussed with the                            patient. Return to normal activities tomorrow.                            Written discharge instructions were provided to the                            patient.                           - Resume previous diet.                           - Continue present medications.                           - Await pathology results.                           - Repeat colonoscopy in 3 years for surveillance.                           - Return to GI clinic PRN.  Procedure Code(s):        --- Professional ---                           [phone], Colonoscopy, flexible; with removal of                            tumor(s), polyp(s), or other lesion(s) by snare                            technique                           45380, 16, Colonoscopy, flexible; with biopsy,                            single or multiple Diagnosis Code(s):        --- Professional ---                           Z12.11, Encounter for screening for malignant                            neoplasm of colon                           K64.8, Other hemorrhoids                           D12.2, Benign neoplasm of ascending colon                           D12.4, Benign neoplasm of descending colon                           D12.3, Benign neoplasm of transverse colon (hepatic                            flexure or splenic flexure)                           K57.30, Diverticulosis of large intestine without                            perforation or abscess without bleeding CPT copyright 2022 American Medical Association. All rights  reserved. The codes documented in this report are preliminary and upon coder review may  be revised to meet current compliance requirements. Elon Alas. Abbey Chatters, DO [facility] Abbey Chatters, DO 02/01/2022 11:20:18 AM This report has been signed electronically. Number of Addenda: 0

## 2022-02-01 NOTE — Anesthesia Postprocedure Evaluation (Signed)
Anesthesia Post Note  Patient: Matthew Dickerson  Procedure(s) Performed: COLONOSCOPY WITH PROPOFOL POLYPECTOMY INTESTINAL  Patient location during evaluation: Phase II Anesthesia Type: General Level of consciousness: awake Pain management: pain level controlled Vital Signs Assessment: post-procedure vital signs reviewed and stable Respiratory status: spontaneous breathing and respiratory function stable Cardiovascular status: blood pressure returned to baseline and stable Postop Assessment: no headache and no apparent nausea or vomiting Anesthetic complications: no Comments: Late entry   No notable events documented.   Last Vitals:  Vitals:   02/01/22 1123 02/01/22 1126  BP: 109/75   Pulse: 63   Resp: 16   Temp:  (!) 36.4 C  SpO2: 95%     Last Pain:  Vitals:   02/01/22 1131  TempSrc:   PainSc: 0-No pain                 Louann Sjogren

## 2022-02-01 NOTE — Interval H&P Note (Signed)
History and Physical Interval Note:  02/01/2022 10:29 AM  Matthew Dickerson  has presented today for surgery, with the diagnosis of screening.  The various methods of treatment have been discussed with the patient and family. After consideration of risks, benefits and other options for treatment, the patient has consented to  Procedure(s) with comments: COLONOSCOPY WITH PROPOFOL (N/A) - 1:45pm, asa 3, need UDS at pre-op as a surgical intervention.  The patient's history has been reviewed, patient examined, no change in status, stable for surgery.  I have reviewed the patient's chart and labs.  Questions were answered to the patient's satisfaction.     Eloise Harman

## 2022-02-01 NOTE — Transfer of Care (Signed)
Immediate Anesthesia Transfer of Care Note  Patient: Matthew Dickerson  Procedure(s) Performed: COLONOSCOPY WITH PROPOFOL POLYPECTOMY INTESTINAL  Patient Location: Short Stay  Anesthesia Type:General  Level of Consciousness: awake, alert , and patient cooperative  Airway & Oxygen Therapy: Patient Spontanous Breathing  Post-op Assessment: Report given to RN, Post -op Vital signs reviewed and stable, and Patient moving all extremities  Post vital signs: Reviewed and stable  Last Vitals:  Vitals Value Taken Time  BP 109/75 02/01/22 1123  Temp    Pulse 63 02/01/22 1123  Resp 16 02/01/22 1123  SpO2 95 % 02/01/22 1123    Last Pain:  Vitals:   02/01/22 1123  TempSrc:   PainSc: 0-No pain      Patients Stated Pain Goal: 9 (31/12/16 2446)  Complications: No notable events documented.

## 2022-02-01 NOTE — Anesthesia Preprocedure Evaluation (Signed)
Anesthesia Evaluation  Patient identified by MRN, date of birth, ID band Patient awake    Reviewed: Allergy & Precautions, H&P , NPO status , Patient's Chart, lab work & pertinent test results, reviewed documented beta blocker date and time   Airway Mallampati: II  TM Distance: >3 FB Neck ROM: full    Dental no notable dental hx.    Pulmonary neg pulmonary ROS, Current Smoker and Patient abstained from smoking.   Pulmonary exam normal breath sounds clear to auscultation       Cardiovascular Exercise Tolerance: Good hypertension, + CAD, + Past MI and + Peripheral Vascular Disease   Rhythm:regular Rate:Normal     Neuro/Psych  Neuromuscular disease  negative psych ROS   GI/Hepatic Neg liver ROS,GERD  Medicated,,  Endo/Other  negative endocrine ROS    Renal/GU negative Renal ROS  negative genitourinary   Musculoskeletal   Abdominal   Peds  Hematology negative hematology ROS (+)   Anesthesia Other Findings Denies cocaine use since UDS  Reproductive/Obstetrics negative OB ROS                             Anesthesia Physical Anesthesia Plan  ASA: 3  Anesthesia Plan: General   Post-op Pain Management:    Induction:   PONV Risk Score and Plan: Propofol infusion  Airway Management Planned:   Additional Equipment:   Intra-op Plan:   Post-operative Plan:   Informed Consent: I have reviewed the patients History and Physical, chart, labs and discussed the procedure including the risks, benefits and alternatives for the proposed anesthesia with the patient or authorized representative who has indicated his/her understanding and acceptance.     Dental Advisory Given  Plan Discussed with: CRNA  Anesthesia Plan Comments:        Anesthesia Quick Evaluation

## 2022-02-02 LAB — SURGICAL PATHOLOGY

## 2022-02-03 ENCOUNTER — Other Ambulatory Visit: Payer: Self-pay | Admitting: Cardiothoracic Surgery

## 2022-02-03 DIAGNOSIS — I7121 Aneurysm of the ascending aorta, without rupture: Secondary | ICD-10-CM

## 2022-02-05 ENCOUNTER — Ambulatory Visit (HOSPITAL_COMMUNITY)
Admission: RE | Admit: 2022-02-05 | Discharge: 2022-02-05 | Disposition: A | Discharge status: Home / Self Care | Payer: Medicare Other | Source: Ambulatory Visit | Attending: Family Medicine | Admitting: Family Medicine

## 2022-02-05 DIAGNOSIS — R590 Localized enlarged lymph nodes: Secondary | ICD-10-CM | POA: Insufficient documentation

## 2022-02-05 LAB — POCT I-STAT CREATININE: Creatinine, Ser: 0.9 mg/dL (ref 0.61–1.24)

## 2022-02-05 MED ORDER — IOHEXOL 300 MG/ML  SOLN
75.0000 mL | Freq: Once | INTRAMUSCULAR | Status: AC | PRN
  Administered 2022-02-05: 75 mL via INTRAVENOUS

## 2022-02-08 ENCOUNTER — Encounter (HOSPITAL_COMMUNITY): Payer: Self-pay | Admitting: Internal Medicine

## 2022-03-05 ENCOUNTER — Ambulatory Visit
Admission: RE | Admit: 2022-03-05 | Discharge: 2022-03-05 | Disposition: A | Discharge status: Home / Self Care | Payer: Medicare Other | Source: Ambulatory Visit | Attending: Surgery | Admitting: Surgery

## 2022-03-05 ENCOUNTER — Inpatient Hospital Stay: Admission: RE | Admit: 2022-03-05 | Payer: Medicare Other | Source: Ambulatory Visit

## 2022-03-05 DIAGNOSIS — I7121 Aneurysm of the ascending aorta, without rupture: Secondary | ICD-10-CM

## 2022-03-05 MED ORDER — IOPAMIDOL (ISOVUE-370) INJECTION 76%
75.0000 mL | Freq: Once | INTRAVENOUS | Status: AC | PRN
  Administered 2022-03-05: 75 mL via INTRAVENOUS

## 2022-03-08 ENCOUNTER — Ambulatory Visit (INDEPENDENT_AMBULATORY_CARE_PROVIDER_SITE_OTHER): Payer: Medicare Other | Admitting: Physician Assistant

## 2022-03-08 VITALS — BP 135/71 | HR 62 | Resp 20 | Wt 232.0 lb

## 2022-03-08 DIAGNOSIS — I7121 Aneurysm of the ascending aorta, without rupture: Secondary | ICD-10-CM | POA: Diagnosis not present

## 2022-03-08 NOTE — Patient Instructions (Addendum)
Risk Modification in those with ascending thoracic aortic aneurysm:  Continue good control of blood pressure (prefer SBP 130/80 or less)-Continue Losartan 25 mg daily  2. Avoid fluoroquinolone antibiotics (I.e Ciprofloxacin, Avelox, Levofloxacin, Ofloxacin)  3.  Use of statin (to decrease cardiovascular risk)-continue Atorvastatin 80 mg daily  4.  Exercise and activity limitations is individualized, but in general, contact sports are to be  avoided and one should avoid heavy lifting (defined as half of ideal body weight) and exercises involving sustained Valsalva maneuver.  5. Counseling for those suspected of having genetically mediated disease. First-degree relatives of those with TAA disease should be screened as well as those who have a connective tissue disease (I.e with Marfan syndrome, Ehlers-Danlos syndrome,  and Loeys-Dietz syndrome) or a  bicuspid aortic valve,have an increased risk for  complications related to TAA. The above does not apply (echo done in 2022 showed AV to be tricuspid and no AS or AI.)  6. He has a history of tobacco abuse, smoking cessation is highly encouraged.
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
## 2022-04-22 ENCOUNTER — Encounter: Payer: Self-pay | Admitting: Radiology

## 2022-05-06 ENCOUNTER — Encounter: Payer: Self-pay | Admitting: Cardiology

## 2022-05-06 ENCOUNTER — Ambulatory Visit: Payer: Medicare Other | Attending: Cardiology | Admitting: Cardiology

## 2022-05-06 VITALS — BP 120/70 | HR 83 | Ht 73.0 in | Wt 219.0 lb

## 2022-05-06 DIAGNOSIS — I2510 Atherosclerotic heart disease of native coronary artery without angina pectoris: Secondary | ICD-10-CM | POA: Insufficient documentation

## 2022-05-06 DIAGNOSIS — E782 Mixed hyperlipidemia: Secondary | ICD-10-CM | POA: Diagnosis present

## 2022-05-06 DIAGNOSIS — I10 Essential (primary) hypertension: Secondary | ICD-10-CM | POA: Diagnosis present

## 2022-05-06 NOTE — Patient Instructions (Signed)
Medication Instructions:  Stop [place] Veterinary surgeon) Continue all other medications.     Labwork: none  Testing/Procedures: none  Follow-Up: 6 months   Any Other Special Instructions Will Be Listed Below (If Applicable).   If you need a refill on your cardiac medications before your next appointment, please call your pharmacy.
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
## 2022-05-19 IMAGING — CT CT ANGIO CHEST
2 of 6 series · 13 of 36 positions shown · IV contrast (iopamidol)
Comparison: 03/14/2019

CLINICAL DATA: Thoracic aortic aneurysm.

EXAM:
CT ANGIOGRAPHY CHEST WITH CONTRAST
TECHNIQUE: Multidetector CT imaging of the chest was performed using the
standard protocol during bolus administration of intravenous
contrast. Multiplanar CT image reconstructions and MIPs were
obtained to evaluate the vascular anatomy.
CONTRAST:  75mL [id number] IOPAMIDOL (FMX8J5-GFJ) INJECTION 76%

[Series 4: cta thorax 2.00 bv36 s3 axial arterial · axial · arterial · 0.71mm/px · z∈[+1544,+1798]mm · 12 of 151 slices shown]
[im 12/151  lung]
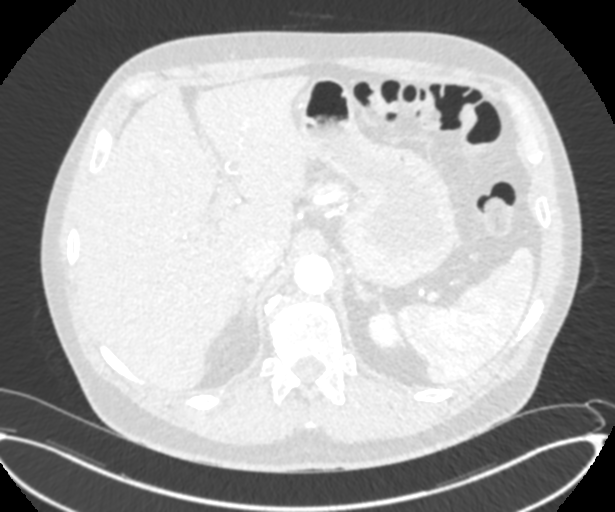
[im 24/151  mediastinal]
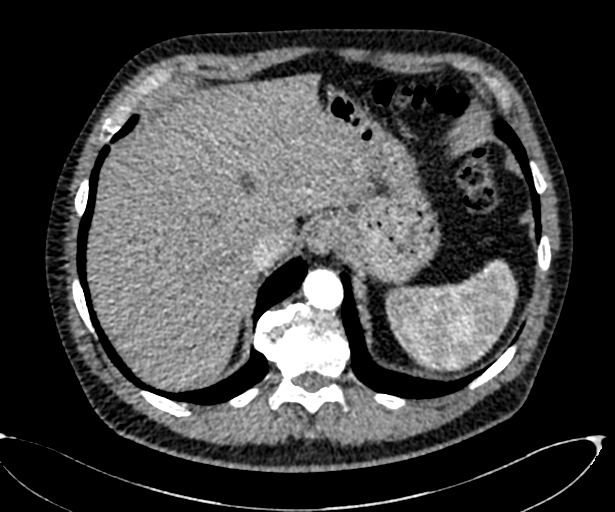
[im 35/151  lung]
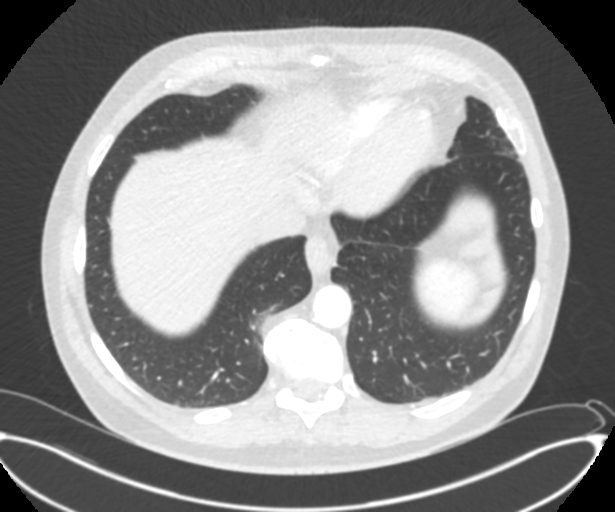
[im 47/151  mediastinal]
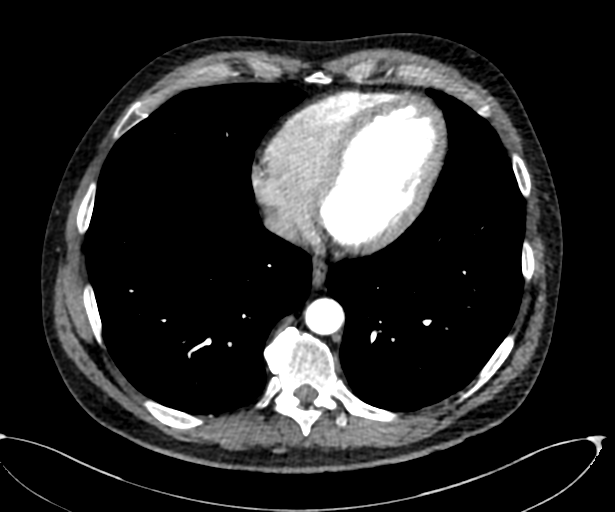
[im 58/151  lung]
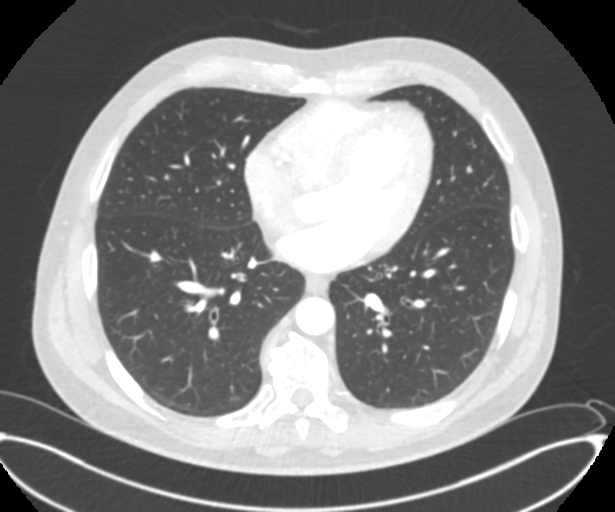
[im 70/151  mediastinal]
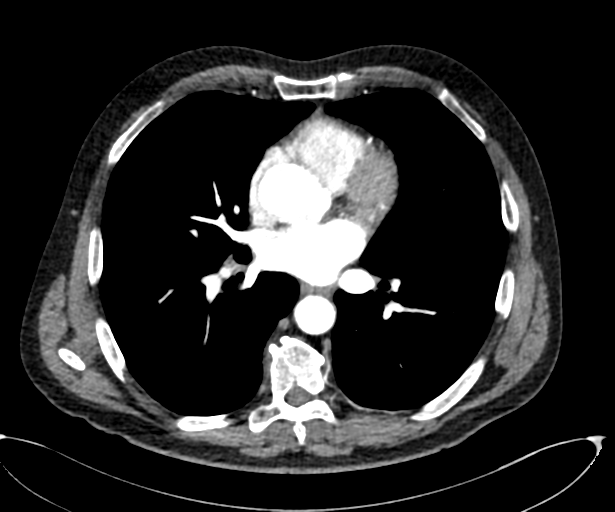
[im 81/151  lung]
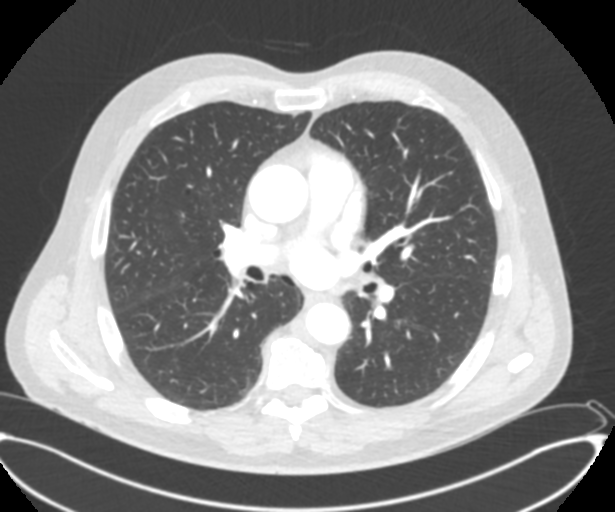
[im 93/151  mediastinal]
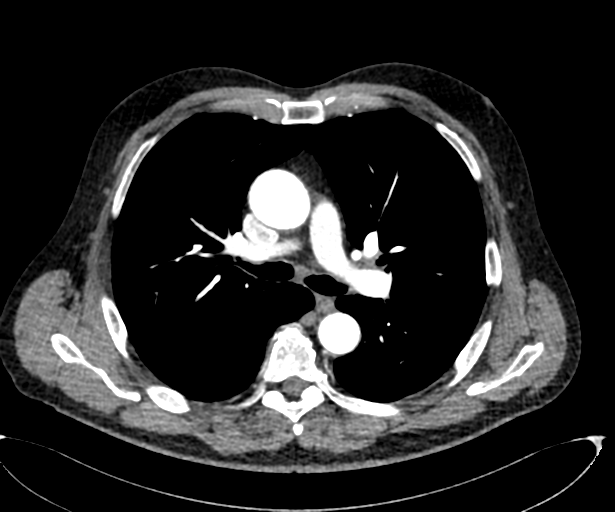
[im 104/151  lung]
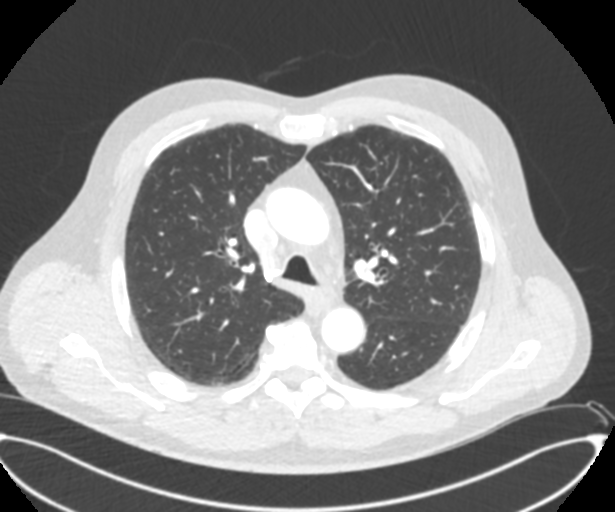
[im 116/151  mediastinal]
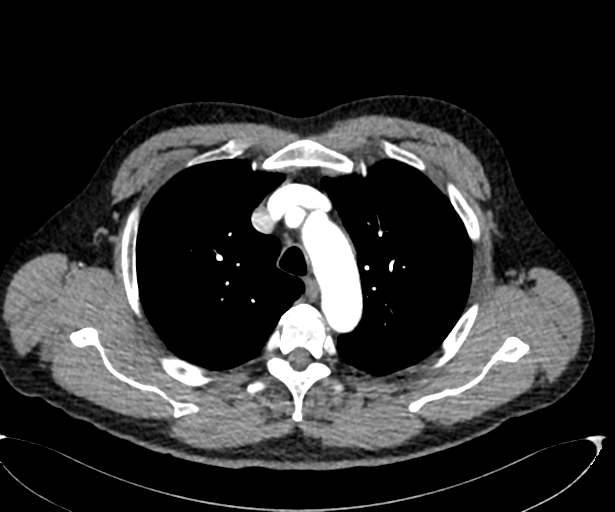
[im 127/151  lung]
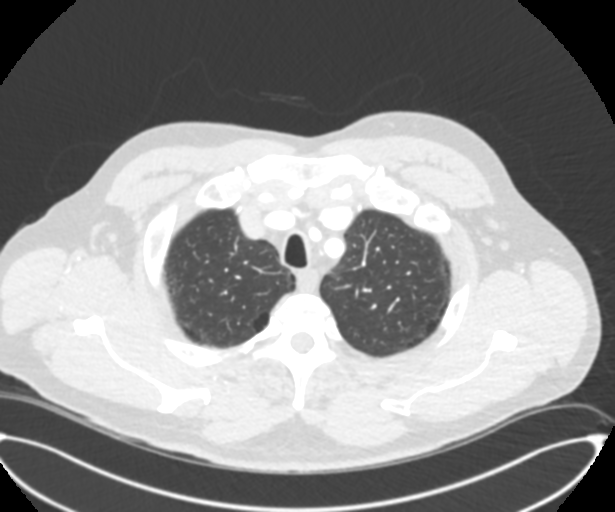
[im 139/151  mediastinal]
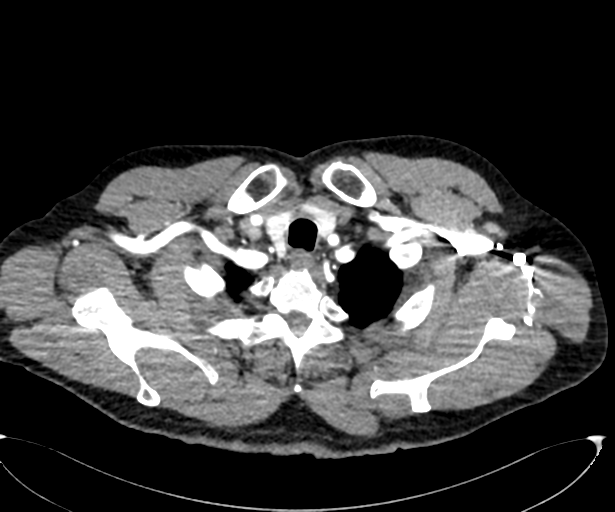

[Series 9: cta thorax 2.00 bv36 s3 cor st · coronal · 0.59mm/px · 1 of 181 slices shown]
[im 91/181  mediastinal]
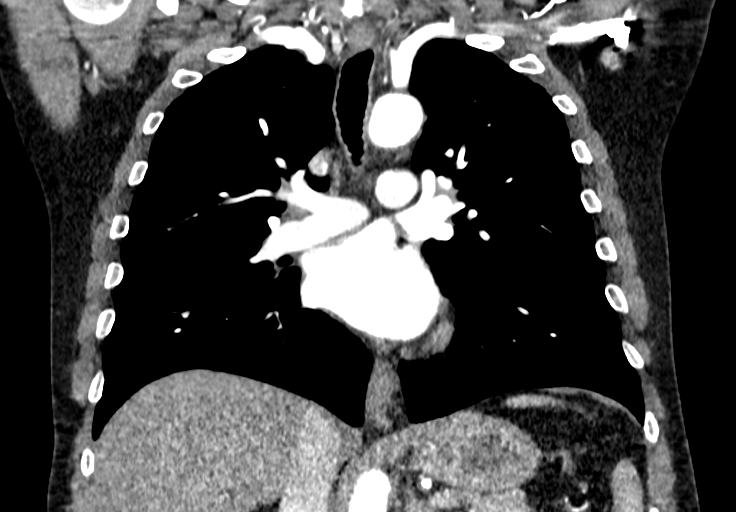

[13 of 36 positions shown; findings below may reference images not displayed]

FINDINGS: Cardiovascular: The heart size is normal. No substantial pericardial
effusion. Coronary artery calcification is evident. Atherosclerotic
calcification is noted in the wall of the thoracic aorta. Ascending
thoracic aortic diameter is stable at 4.2 cm.

Mediastinum/Nodes: No mediastinal lymphadenopathy. There is no hilar
lymphadenopathy. The esophagus has normal imaging features. There is
no axillary lymphadenopathy.

Lungs/Pleura: Stable 7-8 mm right middle lobe perifissural nodule.
No new suspicious pulmonary nodule or mass. No focal airspace
consolidation. No pleural effusion.

Upper Abdomen: Stable appearance posterior left hepatic low-density
lesion, consistent with benign etiology. Previously described tiny
left adrenal adenomas unchanged.

Musculoskeletal: No worrisome lytic or sclerotic osseous
abnormality.

Review of the MIP images confirms the above findings.
IMPRESSION: 1. Stable exam. 4.2 cm ascending thoracic aortic aneurysm. Recommend
annual imaging followup by CTA or MRA. This recommendation follows
5585 ACCF/AHA/AATS/ACR/ASA/SCA/LESHLESH/AU YOUNG/ZOTOVA/MIDO Guidelines for the
Diagnosis and Management of Patients with Thoracic Aortic Disease.
Circulation. 5585; 121: [id number]. Aortic aneurysm NOS (5DLTU-5MK.S)
2. Stable 7-8 mm right middle lobe perifissural nodule, consistent
with benign etiology.
3. Aortic Atherosclerosis (5DLTU-6OI.I).

## 2022-11-09 ENCOUNTER — Ambulatory Visit: Payer: Medicare Other | Admitting: Nurse Practitioner

## 2022-11-09 NOTE — Progress Notes (Deleted)
Cardiology Office Note:  .   Date:  11/09/2022  ID:  Matthew Dickerson, DOB 03-Jul-1955, MRN [id number] PCP: Benita Stabile, MD  [facility] Providers Cardiologist:  Dina Rich, MD { Click to update primary MD,subspecialty MD or APP then REFRESH:1}   History of Present Illness: .   Matthew Dickerson is a 67 y.o. male with a PMH of CAD, s/p NSTEMI 2022, history of drug abuse, hypertension, hyperlipidemia, history of aortic aneurysm (followed by cardiothoracic surgery), GERD, and history of DVT, presents today for 70-month follow-up.  NSTEMI in 2022 in setting of drug abuse (was using marijuana and cocaine), received successful drug-eluting stent to LAD.  Last seen by Dr. Dina Rich on May 06, 2022.  Was doing well at the time.  Brilinta was discontinued.  Today presents for 48-month follow-up.  He states  ROS: ***  Studies Reviewed: .        *** Risk Assessment/Calculations:   {Does this patient have ATRIAL FIBRILLATION?[phone]} No BP recorded.  {Refresh Note OR Click here to enter BP  :1}***       Physical Exam:   VS:  There were no vitals taken for this visit.   Wt Readings from Last 3 Encounters:  05/06/22 219 lb (99.3 kg)  03/08/22 232 lb (105.2 kg)  02/01/22 235 lb (106.6 kg)    GEN: Well nourished, well developed in no acute distress NECK: No JVD; No carotid bruits CARDIAC: ***RRR, no murmurs, rubs, gallops RESPIRATORY:  Clear to auscultation without rales, wheezing or rhonchi  ABDOMEN: Soft, non-tender, non-distended EXTREMITIES:  No edema; No deformity   ASSESSMENT AND PLAN: .   ***    {Are you ordering a CV Procedure (e.g. stress test, cath, DCCV, TEE, etc)?   Press F2        :[id number]}  Dispo: ***  Signed, Sharlene Dory, NP

## 2022-11-10 ENCOUNTER — Encounter: Payer: Self-pay | Admitting: Nurse Practitioner

## 2022-11-13 ENCOUNTER — Other Ambulatory Visit: Payer: Self-pay | Admitting: Cardiology

## 2022-12-31 ENCOUNTER — Ambulatory Visit: Payer: Medicare Other | Attending: Nurse Practitioner | Admitting: Nurse Practitioner

## 2022-12-31 ENCOUNTER — Encounter: Payer: Self-pay | Admitting: Nurse Practitioner

## 2022-12-31 VITALS — BP 130/72 | HR 60 | Ht 73.0 in | Wt 236.2 lb

## 2022-12-31 DIAGNOSIS — I7121 Aneurysm of the ascending aorta, without rupture: Secondary | ICD-10-CM | POA: Insufficient documentation

## 2022-12-31 DIAGNOSIS — I2510 Atherosclerotic heart disease of native coronary artery without angina pectoris: Secondary | ICD-10-CM | POA: Diagnosis present

## 2022-12-31 DIAGNOSIS — Z720 Tobacco use: Secondary | ICD-10-CM | POA: Diagnosis present

## 2022-12-31 DIAGNOSIS — E785 Hyperlipidemia, unspecified: Secondary | ICD-10-CM | POA: Diagnosis present

## 2022-12-31 DIAGNOSIS — I10 Essential (primary) hypertension: Secondary | ICD-10-CM | POA: Insufficient documentation

## 2022-12-31 DIAGNOSIS — R61 Generalized hyperhidrosis: Secondary | ICD-10-CM | POA: Diagnosis present

## 2022-12-31 MED ORDER — ATORVASTATIN CALCIUM 80 MG PO TABS: 80.0000 mg | ORAL_TABLET | Freq: Every day | ORAL | 1 refills | Status: DC

## 2022-12-31 NOTE — Patient Instructions (Addendum)

## 2022-12-31 NOTE — Progress Notes (Unsigned)
Cardiology Office Note:  .   Date:  12/31/2022 ID:  Matthew Dickerson, DOB 1956-01-20, MRN [id number] PCP: Matthew Stabile, MD  [facility] Providers Cardiologist:  Dina Rich, MD    History of Present Illness: .   Matthew Dickerson is a 67 y.o. male with a PMH of CAD, history of NSTEMI in 2022, hypertension, hyperlipidemia, polysubstance abuse, and ascending aortic aneurysm, followed by cardiothoracic surgery), who presents today for 71-month follow-up appointment.  Was diagnosed as NSTEMI 2022.  Was having chest pain in the setting of cocaine/marijuana use.  EKG revealed right bundle branch block with inferior/lateral ST depression.  See cath report noted below, at that time he received a drug-eluting stent to the LAD.  EF at that time 45 to 50%, grade 1 DD.  Last seen by Dr. Dina Rich on May 06, 2022.  Brilinta was discontinued as he completed over 1 year on DAPT.  Overall he was doing well.  Today he presents for 54-month follow-up appointment.  He states he is doing well from a cardiac perspective. Does admit to some MSK issues.  Does admit that 2 months ago he had an episode of chest pain after working outside almost all day says he "overdid it" and was dehydrated, took a nitroglycerin, rested and drink fluids, and stated symptoms were relieved.  Denies any recurrence.  Wife admits to patient having night sweats, patient states that this has been chronic for him.  Currently smokes half pack per day to 1 PPD. Denies any  shortness of breath, palpitations, syncope, presyncope, dizziness, orthopnea, PND, swelling or significant weight changes, acute bleeding, or claudication.  ROS: Negative. See HPI.   Studies Reviewed: Marland Kitchen     EKG: EKG Interpretation Date/Time:  Friday December 31 2022 11:28:33 EST Ventricular Rate:  71 PR Interval:  164 QRS Duration:  156 QT Interval:  444 QTC Calculation: 482 R Axis:   -53  Text Interpretation: Sinus rhythm with Premature atrial  complexes Right bundle branch block Left anterior fascicular block Bifascicular block Minimal voltage criteria for LVH, may be normal variant ( R in aVL ) Septal infarct , age undetermined When compared with ECG of 14-Nov-2020 03:22, Premature ventricular complexes are no longer Present Premature atrial complexes are now Present Left anterior fascicular block is now Present Confirmed by Sharlene Dory [phone]) on 12/31/2022 11:47:27 AM   LHC 10/2020:    Mid LM lesion is 20% stenosed.   Prox LAD lesion is 80% stenosed.   Prox LAD to Mid LAD lesion is 70% stenosed.   A stent was successfully placed.   Post intervention, there is a 0% residual stenosis.   Post intervention, there is a 0% residual stenosis.   Mild 20% smooth distal left main stenosis prior to trifurcation into a large LAD, ramus intermediate vessel, and small left circumflex vessel.     The LAD had proximal calcification with 80% eccentric smooth proximal stenosis before small first diagonal branch followed by irregularity and 70% stenosis beyond this diagonal vessel.   The ramus intermediate vessel was large caliber bifurcating vessel which was angiographically normal.   The left circumflex vessel was small caliber and normal.   The RCA was angiographically normal.   Successful PCI to the LAD with ultimate insertion of a 3.0 x 32 mm Synergy DES stent to cover both lesions postdilated to 3.29 mm with the stenoses being reduced to 0%.   LVEDP: 9 mmHg   RECOMMENDATION:  DAPT for minimum of 1  year.  Continue medical therapy for treatment of potential spasm and optimal blood pressure control.  The patient must discontinue drug use.  Aggressive lipid-lowering therapy with target LDL less than 70.  Echo 10/2020: 1. Left ventricular ejection fraction, by estimation, is 45 to 50%. The  left ventricle has low normal function. The left ventricle demonstrates  global hypokinesis. There is mild left ventricular hypertrophy. Left   ventricular diastolic parameters are  consistent with Grade I diastolic dysfunction (impaired relaxation).   2. Right ventricular systolic function is normal. The right ventricular  size is normal. Tricuspid regurgitation signal is inadequate for assessing  PA pressure.   3. The mitral valve is normal in structure. No evidence of mitral valve  regurgitation. No evidence of mitral stenosis.   4. The aortic valve is tricuspid. There is mild calcification of the  aortic valve. There is mild thickening of the aortic valve. Aortic valve  regurgitation is not visualized. No aortic stenosis is present.   5. The inferior vena cava is dilated in size with >50% respiratory  variability, suggesting right atrial pressure of 8 mmHg.  Physical Exam:   VS:  BP 130/72   Pulse 60   Ht 6\' 1"  (1.854 m)   Wt 236 lb 3.2 oz (107.1 kg)   SpO2 95%   BMI 31.16 kg/m    Wt Readings from Last 3 Encounters:  12/31/22 236 lb 3.2 oz (107.1 kg)  05/06/22 219 lb (99.3 kg)  03/08/22 232 lb (105.2 kg)    GEN: Well nourished, well developed in no acute distress NECK: No JVD; No carotid bruits CARDIAC: S1/S2, RRR, no murmurs, rubs, gallops RESPIRATORY:  Clear to auscultation without rales, wheezing or rhonchi  ABDOMEN: Soft, non-tender, non-distended EXTREMITIES:  No edema; No deformity   ASSESSMENT AND PLAN: .    CAD Stable with no recent anginal symptoms. One time limited episode 2 months ago, etiology multifactorial in setting of overworking outside and dehydration, denies any recurrence to symptoms.  No indication for ischemic evaluation.  Continue aspirin, atorvastatin, losartan, and nitroglycerin as needed. Heart healthy diet and regular cardiovascular exercise encouraged.  Care and ED precautions discussed.  HTN Blood pressure stable. Discussed to monitor BP at home at least 2 hours after medications and sitting for 5-10 minutes.  No medication changes at this time. Heart healthy diet and regular  cardiovascular exercise encouraged.   HLD LDL 41 1 year ago. Continue atorvastatin. Heart healthy diet and regular cardiovascular exercise encouraged.  Continue to follow with PCP.  Ascending aortic aneurysm CT scan 02/2022 revealed 4.2 cm ascending thoracic aorta with no significant interval change.  Recommended annual imaging follow-up.  This is being managed by cardiothoracic surgery.  Continue to follow-up with cardiothoracic surgery. Care and ED precautions discussed.   Tobacco abuse Smoking cessation encouraged and discussed.   Night sweats Etiology unclear. Recommended to f/u with PCP regarding this for further evaluation. Continue to follow with PCP.      Dispo: Will provide refill per his request.  Follow-up with Dr. Dina Rich or APP in 6 months or sooner if anything changes.  Signed, Sharlene Dory, NP

## 2023-05-07 ENCOUNTER — Other Ambulatory Visit: Payer: Self-pay

## 2023-05-07 ENCOUNTER — Emergency Department (HOSPITAL_COMMUNITY)
Admission: EM | Admit: 2023-05-07 | Discharge: 2023-05-07 | Disposition: A | Discharge status: Home / Self Care | Attending: Emergency Medicine | Admitting: Emergency Medicine

## 2023-05-07 ENCOUNTER — Encounter (HOSPITAL_COMMUNITY): Payer: Self-pay

## 2023-05-07 DIAGNOSIS — W293XXA Contact with powered garden and outdoor hand tools and machinery, initial encounter: Secondary | ICD-10-CM | POA: Diagnosis not present

## 2023-05-07 DIAGNOSIS — I10 Essential (primary) hypertension: Secondary | ICD-10-CM | POA: Diagnosis not present

## 2023-05-07 DIAGNOSIS — S81012A Laceration without foreign body, left knee, initial encounter: Secondary | ICD-10-CM | POA: Diagnosis not present

## 2023-05-07 DIAGNOSIS — Z7982 Long term (current) use of aspirin: Secondary | ICD-10-CM | POA: Diagnosis not present

## 2023-05-07 DIAGNOSIS — Z79899 Other long term (current) drug therapy: Secondary | ICD-10-CM | POA: Insufficient documentation

## 2023-05-07 DIAGNOSIS — I2510 Atherosclerotic heart disease of native coronary artery without angina pectoris: Secondary | ICD-10-CM | POA: Insufficient documentation

## 2023-05-07 DIAGNOSIS — Z23 Encounter for immunization: Secondary | ICD-10-CM | POA: Insufficient documentation

## 2023-05-07 MED ORDER — TETANUS-DIPHTH-ACELL PERTUSSIS 5-2.5-18.5 LF-MCG/0.5 IM SUSY
0.5000 mL | PREFILLED_SYRINGE | Freq: Once | INTRAMUSCULAR | Status: AC
  Administered 2023-05-07: 0.5 mL via INTRAMUSCULAR
  Filled 2023-05-07: qty 0.5

## 2023-05-07 MED ORDER — LIDOCAINE-EPINEPHRINE (PF) 2 %-1:200000 IJ SOLN
10.0000 mL | Freq: Once | INTRAMUSCULAR | Status: AC
  Administered 2023-05-07: 10 mL via INTRADERMAL
  Filled 2023-05-07: qty 20

## 2023-05-07 NOTE — ED Provider Notes (Signed)
 [place] EMERGENCY DEPARTMENT AT [facility] Provider Note   CSN: [id number] Arrival date & time: 05/07/23  0037     History  Chief Complaint  Patient presents with   Laceration    Matthew Dickerson is a 68 y.o. male.  HPI Patient presents for laceration.  Medical history includes HTN, CAD, arthritis, ascending aortic aneurysm, GERD.  Patient was clearing some land throughout the day today.  This evening, at around 8:30 PM, he caught his anterior left knee with a chainsaw blade.  He sustained a laceration to this area.  He did some cleaning and dressing at home.  He presents to the ED at the past of his wife.  Patient denies any other injuries.    Home Medications Prior to Admission medications   Medication Sig Start Date End Date Taking? Authorizing Provider  aspirin 81 MG EC tablet Take 1 tablet (81 mg total) by mouth daily. Swallow whole. 11/14/20   Marinda Elk, MD  atorvastatin (LIPITOR) 80 MG tablet Take 1 tablet (80 mg total) by mouth daily. 12/31/22   Sharlene Dory, NP  losartan (COZAAR) 25 MG tablet Take 1 tablet (25 mg total) by mouth daily. 11/14/20   Marinda Elk, MD  nitroGLYCERIN (NITROSTAT) 0.4 MG SL tablet Place 1 tablet (0.4 mg total) under the tongue every 5 (five) minutes x 3 doses as needed for chest pain (if no relief after 2nd dose, proceed to the ED or call 911). 03/12/21   Antoine Poche, MD  omeprazole (PRILOSEC OTC) 20 MG tablet Take 20 mg by mouth daily.    [provider]      Allergies    Patient has no known allergies.    Review of Systems   Review of Systems  Skin:  Positive for wound.  All other systems reviewed and are negative.   Physical Exam Updated Vital Signs BP (!) 151/95   Pulse 83   Temp 98.5 F (36.9 C) (Oral)   Resp 20   Ht 6\' 1"  (1.854 m)   Wt 107.1 kg   SpO2 98%   BMI 31.15 kg/m  Physical Exam Vitals and nursing note reviewed.  Constitutional:      General: He is not in acute  distress.    Appearance: Normal appearance. He is well-developed. He is not ill-appearing, toxic-appearing or diaphoretic.  HENT:     Head: Normocephalic and atraumatic.     Right Ear: External ear normal.     Left Ear: External ear normal.     Nose: Nose normal.     Mouth/Throat:     Mouth: Mucous membranes are moist.  Eyes:     Extraocular Movements: Extraocular movements intact.     Conjunctiva/sclera: Conjunctivae normal.  Cardiovascular:     Rate and Rhythm: Normal rate and regular rhythm.  Pulmonary:     Effort: Pulmonary effort is normal. No respiratory distress.  Abdominal:     General: There is no distension.     Palpations: Abdomen is soft.  Musculoskeletal:        General: No swelling or deformity.     Cervical back: Normal range of motion and neck supple.  Skin:    General: Skin is warm and dry.     Coloration: Skin is not jaundiced or pale.  Neurological:     General: No focal deficit present.     Mental Status: He is alert and oriented to person, place, and time.  Psychiatric:  Mood and Affect: Mood normal.        Behavior: Behavior normal.     ED Results / Procedures / Treatments   Labs (all labs ordered are listed, but only abnormal results are displayed) Labs Reviewed - No data to display  EKG None  Radiology No results found.  Procedures .Laceration Repair  Date/Time: 05/07/2023 1:37 AM  Performed by: Gloris Manchester, MD Authorized by: Gloris Manchester, MD   Consent:    Consent obtained:  Verbal   Consent given by:  Patient   Risks, benefits, and alternatives were discussed: yes     Risks discussed:  Infection and pain   Alternatives discussed:  No treatment Universal protocol:    Procedure explained and questions answered to patient or proxy's satisfaction: yes   Anesthesia:    Anesthesia method:  Local infiltration   Local anesthetic:  Lidocaine 2% WITH epi Laceration details:    Location:  Leg   Leg location:  L knee   Length (cm):   6   Depth (mm):  3 Pre-procedure details:    Preparation:  Patient was prepped and draped in usual sterile fashion Treatment:    Area cleansed with:  Saline   Amount of cleaning:  Extensive   Irrigation solution:  Sterile saline   Irrigation volume:  1L   Irrigation method:  Syringe   Visualized foreign bodies/material removed: no   Skin repair:    Repair method:  Sutures   Suture size:  4-0   Suture material:  Nylon   Suture technique:  Running   Number of sutures:  11 Approximation:    Approximation:  Close Repair type:    Repair type:  Simple Post-procedure details:    Dressing: Xeroform and Kerlix.   Procedure completion:  Tolerated well, no immediate complications     Medications Ordered in ED Medications  lidocaine-EPINEPHrine (XYLOCAINE W/EPI) 2 %-1:200000 (PF) injection 10 mL (10 mLs Intradermal Given 05/07/23 0104)  Tdap (BOOSTRIX) injection 0.5 mL (0.5 mLs Intramuscular Given 05/07/23 0104)    ED Course/ Medical Decision Making/ A&P                                 Medical Decision Making Risk Prescription drug management.   Patient presents for superficial laceration to anterior aspect of left knee.  This occurred approximate 4 hours prior to arrival.  On exam, patient is well-appearing.  Wound is currently hemostatic.  Tetanus was updated.  Laceration was thoroughly irrigated, explored, and repaired.  Wound is superficial.  No antibiotics indicated.  Patient was advised to return if area becomes inflamed.  He was discharged in stable condition.        Final Clinical Impression(s) / ED Diagnoses Final diagnoses:  Knee laceration, left, initial encounter    Rx / DC Orders ED Discharge Orders     None         Gloris Manchester, MD 05/07/23 0140

## 2023-05-07 NOTE — ED Triage Notes (Signed)
 Pov from home cc of laceration to top of left leg from chain saw. Said he rinsed it out and dressed it, just wants it checked out.  Happened around 830pm.

## 2023-05-07 NOTE — Discharge Instructions (Signed)
 Stitches will need to be removed in 7 to 10 days.  Keep area clean and dry.  If area becomes increasingly red, swollen, painful, or drains pus, please return to the emergency department.

## 2023-05-14 ENCOUNTER — Other Ambulatory Visit: Payer: Self-pay

## 2023-05-14 ENCOUNTER — Emergency Department (HOSPITAL_COMMUNITY)
Admission: EM | Admit: 2023-05-14 | Discharge: 2023-05-14 | Disposition: A | Discharge status: Home / Self Care | Attending: Student | Admitting: Student

## 2023-05-14 DIAGNOSIS — Z5189 Encounter for other specified aftercare: Secondary | ICD-10-CM

## 2023-05-14 DIAGNOSIS — Z7982 Long term (current) use of aspirin: Secondary | ICD-10-CM | POA: Diagnosis not present

## 2023-05-14 DIAGNOSIS — Z4802 Encounter for removal of sutures: Secondary | ICD-10-CM | POA: Diagnosis present

## 2023-05-14 DIAGNOSIS — Z4801 Encounter for change or removal of surgical wound dressing: Secondary | ICD-10-CM | POA: Diagnosis not present

## 2023-05-14 NOTE — ED Triage Notes (Signed)
 Pt to ED requesting suture removal, sutures were placed on 3/15

## 2023-05-14 NOTE — Discharge Instructions (Signed)
 Return here on Tuesday to have your sutures removed.  Continue to keep the area clean with mild soap and water and keep it wrapped up.

## 2023-05-14 NOTE — ED Notes (Signed)
 ED Provider at bedside.

## 2023-05-17 ENCOUNTER — Emergency Department (HOSPITAL_COMMUNITY)
Admission: EM | Admit: 2023-05-17 | Discharge: 2023-05-17 | Disposition: A | Discharge status: Home / Self Care | Attending: Emergency Medicine | Admitting: Emergency Medicine

## 2023-05-17 ENCOUNTER — Encounter (HOSPITAL_COMMUNITY): Payer: Self-pay

## 2023-05-17 ENCOUNTER — Other Ambulatory Visit: Payer: Self-pay

## 2023-05-17 DIAGNOSIS — Z7982 Long term (current) use of aspirin: Secondary | ICD-10-CM | POA: Diagnosis not present

## 2023-05-17 DIAGNOSIS — Z4802 Encounter for removal of sutures: Secondary | ICD-10-CM | POA: Insufficient documentation

## 2023-05-17 NOTE — ED Triage Notes (Addendum)
 Pt had sutures put in place on right leg x 2 weeks ago and is here to get them removed.

## 2023-05-17 NOTE — ED Notes (Signed)
 Patient discharged. Provider spoke to patient. Paperwork given to patient and reviewed. Pt verbalized understanding. VSS. A+Ox4. Patient ambulated out of the ER with steady, independent gait. No iv in place.

## 2023-05-17 NOTE — ED Provider Notes (Signed)
  [place] EMERGENCY DEPARTMENT AT [facility] Provider Note   CSN: [id number] Arrival date & time: 05/17/23  2206     History  No chief complaint on file.   Matthew Dickerson is a 68 y.o. male.  Patient is here for suture removal.  Patient denies any complaints.  Patient cut his left leg with a chainsaw.  The history is provided by the patient. No language interpreter was used.       Home Medications Prior to Admission medications   Medication Sig Start Date End Date Taking? Authorizing Provider  aspirin 81 MG EC tablet Take 1 tablet (81 mg total) by mouth daily. Swallow whole. 11/14/20   Marinda Elk, MD  atorvastatin (LIPITOR) 80 MG tablet Take 1 tablet (80 mg total) by mouth daily. 12/31/22   Sharlene Dory, NP  losartan (COZAAR) 25 MG tablet Take 1 tablet (25 mg total) by mouth daily. 11/14/20   Marinda Elk, MD  nitroGLYCERIN (NITROSTAT) 0.4 MG SL tablet Place 1 tablet (0.4 mg total) under the tongue every 5 (five) minutes x 3 doses as needed for chest pain (if no relief after 2nd dose, proceed to the ED or call 911). 03/12/21   Antoine Poche, MD  omeprazole (PRILOSEC OTC) 20 MG tablet Take 20 mg by mouth daily.    [provider]      Allergies    Patient has no known allergies.    Review of Systems   Review of Systems  All other systems reviewed and are negative.   Physical Exam Updated Vital Signs BP (!) 144/70   Pulse 70   Temp 98.8 F (37.1 C) (Oral)   Resp 18   SpO2 97%  Physical Exam Vitals reviewed.  Constitutional:      Appearance: Normal appearance.  Skin:    General: Skin is warm.     Comments: Running suture left leg, slight erythema no purulent drainage.  Neurovascular neurosensory intact  Neurological:     General: No focal deficit present.     Mental Status: He is alert.     ED Results / Procedures / Treatments   Labs (all labs ordered are listed, but only abnormal results are displayed) Labs  Reviewed - No data to display  EKG None  Radiology No results found.  Procedures Procedures    Medications Ordered in ED Medications - No data to display  ED Course/ Medical Decision Making/ A&P                                 Medical Decision Making Patient is here for suture removal  Risk OTC drugs. Risk Details: Sutures removed by me.           Final Clinical Impression(s) / ED Diagnoses Final diagnoses:  Visit for suture removal    Rx / DC Orders ED Discharge Orders     None      Sutures removed patient advised to return to the emergency department if any problems.   Osie Cheeks 05/17/23 Janetta Hora    Bethann Berkshire, MD 05/19/23 [phone]

## 2023-05-29 NOTE — ED Provider Notes (Signed)
 [place] EMERGENCY DEPARTMENT AT [facility] Provider Note   CSN: [id number] Arrival date & time: 05/14/23  1859     History  Chief Complaint  Patient presents with   Suture / Staple Removal    Matthew Dickerson is a 68 y.o. male.   Suture / Staple Removal Pertinent negatives include no chest pain and no shortness of breath.       Matthew Dickerson is a 68 y.o. male who presents to the Emergency Department requesting suture removal.  He was seen here on 05/07/2023 for laceration of his left knee.  Sutures were placed.  He states that he has been continuing to work and couple more days of strenuous outdoor activity to complete his current project.  He denies any pain swelling or drainage at the wound site.  No fever or chills.    Home Medications Prior to Admission medications   Medication Sig Start Date End Date Taking? Authorizing Provider  aspirin  81 MG EC tablet Take 1 tablet (81 mg total) by mouth daily. Swallow whole. 11/14/20   Macdonald Savoy, MD  atorvastatin  (LIPITOR ) 80 MG tablet Take 1 tablet (80 mg total) by mouth daily. 12/31/22   Lasalle Pointer, NP  losartan  (COZAAR ) 25 MG tablet Take 1 tablet (25 mg total) by mouth daily. 11/14/20   Macdonald Savoy, MD  nitroGLYCERIN  (NITROSTAT ) 0.4 MG SL tablet Place 1 tablet (0.4 mg total) under the tongue every 5 (five) minutes x 3 doses as needed for chest pain (if no relief after 2nd dose, proceed to the ED or call 911). 03/12/21   Laurann Pollock, MD  omeprazole  (PRILOSEC  OTC) 20 MG tablet Take 20 mg by mouth daily.    [provider]      Allergies    Patient has no known allergies.    Review of Systems   Review of Systems  Constitutional:  Negative for chills and fever.  Respiratory:  Negative for shortness of breath.   Cardiovascular:  Negative for chest pain.  Musculoskeletal:  Negative for arthralgias.  Skin:  Negative for color change.       Laceration left knee with sutures in place   Neurological:  Negative for dizziness, weakness and numbness.    Physical Exam Updated Vital Signs BP (!) 150/92   Pulse 94   Temp 99.3 F (37.4 C) (Oral)   Resp 16   SpO2 95%  Physical Exam Vitals and nursing note reviewed.  Constitutional:      General: He is not in acute distress.    Appearance: Normal appearance. He is not ill-appearing or toxic-appearing.  Cardiovascular:     Rate and Rhythm: Normal rate and regular rhythm.     Pulses: Normal pulses.  Pulmonary:     Effort: Pulmonary effort is normal.  Musculoskeletal:        General: No swelling, tenderness or deformity.     Right lower leg: No edema.     Left lower leg: No edema.     Comments: Laceration anterior left knee with sutures in place.  Appears to be healing well.  No evidence of wound dehiscence, no surrounding erythema edema or purulent drainage.  Skin:    General: Skin is warm.     Capillary Refill: Capillary refill takes less than 2 seconds.     Findings: No bruising or erythema.  Neurological:     General: No focal deficit present.     Mental Status: He is alert.  Sensory: No sensory deficit.     Motor: No weakness.     ED Results / Procedures / Treatments   Labs (all labs ordered are listed, but only abnormal results are displayed) Labs Reviewed - No data to display  EKG None  Radiology No results found.  Procedures Procedures    Medications Ordered in ED Medications - No data to display  ED Course/ Medical Decision Making/ A&P                                 Medical Decision Making Patient here for evaluation of possible suture removal.  He was here on 05/07/2023 sutures were placed to the anterior left knee.  He has been working outside completing a project that requires walking and bending of his knees.  He denies any pain or complications with wound healing.  No fever or chills.   Well-appearing on my exam.  No evidence of wound infection or dehiscence.  I am concerned  with his reported activity over the next few days that his wound may reopen if sutures are removed at this time.  I feel that it would be beneficial to wait a few more days before sutures are removed as patient states that he needs to complete this current project  Amount and/or Complexity of Data Reviewed Discussion of management or test interpretation with external provider(s):  Due to patient's current activity, will leave stitches in for few more days he will return at that time for suture removal.  It appears to be healing well I do not see any evidence of infection or dehiscence at this time.           Final Clinical Impression(s) / ED Diagnoses Final diagnoses:  Visit for wound check    Rx / DC Orders ED Discharge Orders     None         Catherne Clubs, PA-C 05/29/23 1059    Kommor, Moore, MD 06/23/23 [phone]

## 2023-06-12 ENCOUNTER — Other Ambulatory Visit: Payer: Self-pay | Admitting: Nurse Practitioner

## 2023-06-30 ENCOUNTER — Encounter: Payer: Self-pay | Admitting: Nurse Practitioner

## 2023-06-30 ENCOUNTER — Ambulatory Visit: Payer: Medicare Other | Attending: Nurse Practitioner | Admitting: Nurse Practitioner

## 2023-06-30 VITALS — BP 118/76 | HR 56 | Ht 73.0 in | Wt 245.8 lb

## 2023-06-30 DIAGNOSIS — E785 Hyperlipidemia, unspecified: Secondary | ICD-10-CM | POA: Insufficient documentation

## 2023-06-30 DIAGNOSIS — I7121 Aneurysm of the ascending aorta, without rupture: Secondary | ICD-10-CM | POA: Diagnosis present

## 2023-06-30 DIAGNOSIS — I10 Essential (primary) hypertension: Secondary | ICD-10-CM | POA: Diagnosis present

## 2023-06-30 DIAGNOSIS — I2510 Atherosclerotic heart disease of native coronary artery without angina pectoris: Secondary | ICD-10-CM | POA: Diagnosis present

## 2023-06-30 DIAGNOSIS — Z720 Tobacco use: Secondary | ICD-10-CM | POA: Insufficient documentation

## 2023-06-30 DIAGNOSIS — R252 Cramp and spasm: Secondary | ICD-10-CM | POA: Insufficient documentation

## 2023-06-30 MED ORDER — ATORVASTATIN CALCIUM 80 MG PO TABS: 80.0000 mg | ORAL_TABLET | Freq: Every day | ORAL | 3 refills | Status: DC

## 2023-06-30 NOTE — Patient Instructions (Signed)
 Medication Instructions:  No changes *If you need a refill on your cardiac medications before your next appointment, please call your pharmacy*  Lab Work: No labs  Testing/Procedures: No testing  Follow-Up: At [facility], you and your health needs are our priority.  As part of our continuing mission to provide you with exceptional heart care, our providers are all part of one team.  This team includes your primary Cardiologist (physician) and Advanced Practice Providers or APPs (Physician Assistants and Nurse Practitioners) who all work together to provide you with the care you need, when you need it.  Your next appointment:   6 month(s)  Provider:   Lasalle Pointer, NP  We recommend signing up for the patient portal called "MyChart".  Sign up information is provided on this After Visit Summary.  MyChart is used to connect with patients for Virtual Visits (Telemedicine).  Patients are able to view lab/test results, encounter notes, upcoming appointments, etc.  Non-urgent messages can be sent to your provider as well.   To learn more about what you can do with MyChart, go to [email or web address].

## 2023-06-30 NOTE — Progress Notes (Addendum)
 Cardiology Office Note:  .   Date:  06/30/2023 ID:  Aundria Bloom, DOB 16-Jun-1955, MRN [id number] PCP: Omie Bickers, MD  [facility] Providers Cardiologist:  Armida Lander, MD    History of Present Illness: .   Matthew Dickerson is a 68 y.o. male with a PMH of CAD, history of NSTEMI in 2022, hypertension, hyperlipidemia, polysubstance abuse, and ascending aortic aneurysm, followed by cardiothoracic surgery), who presents today for 35-month follow-up appointment.  Was diagnosed as NSTEMI 2022.  Was having chest pain in the setting of cocaine/marijuana use.  EKG revealed right bundle branch block with inferior/lateral ST depression.  See cath report noted below, at that time he received a drug-eluting stent to the LAD.  EF at that time 45 to 50%, grade 1 DD.  Last seen by Dr. Armida Lander on May 06, 2022.  Brilinta  was discontinued as he completed over 1 year on DAPT.  Overall he was doing well.  12/31/2022 - Today he presents for 29-month follow-up appointment.  He states he is doing well from a cardiac perspective. Does admit to some MSK issues.  Does admit that 2 months ago he had an episode of chest pain after working outside almost all day says he "overdid it" and was dehydrated, took a nitroglycerin , rested and drink fluids, and stated symptoms were relieved.  Denies any recurrence.  Wife admits to patient having night sweats, patient states that this has been chronic for him.  Currently smokes half pack per day to 1 PPD. Denies any  shortness of breath, palpitations, syncope, presyncope, dizziness, orthopnea, PND, swelling or significant weight changes, acute bleeding, or claudication.  06/30/2023 - Doing well. Denies any acute cardiac complaints or issues. Denies any chest pain, shortness of breath, palpitations, syncope, presyncope, dizziness, orthopnea, PND, swelling or significant weight changes, acute bleeding, or claudication. Does admit to some occasional leg cramps.   ROS:  Negative. See HPI.   Studies Reviewed: Aaron Aas     EKG: EKG is not ordered today.     LHC 10/2020:    Mid LM lesion is 20% stenosed.   Prox LAD lesion is 80% stenosed.   Prox LAD to Mid LAD lesion is 70% stenosed.   A stent was successfully placed.   Post intervention, there is a 0% residual stenosis.   Post intervention, there is a 0% residual stenosis.   Mild 20% smooth distal left main stenosis prior to trifurcation into a large LAD, ramus intermediate vessel, and small left circumflex vessel.     The LAD had proximal calcification with 80% eccentric smooth proximal stenosis before small first diagonal branch followed by irregularity and 70% stenosis beyond this diagonal vessel.   The ramus intermediate vessel was large caliber bifurcating vessel which was angiographically normal.   The left circumflex vessel was small caliber and normal.   The RCA was angiographically normal.   Successful PCI to the LAD with ultimate insertion of a 3.0 x 32 mm Synergy DES stent to cover both lesions postdilated to 3.29 mm with the stenoses being reduced to 0%.   LVEDP: 9 mmHg   RECOMMENDATION:  DAPT for minimum of 1 year.  Continue medical therapy for treatment of potential spasm and optimal blood pressure control.  The patient must discontinue drug use.  Aggressive lipid-lowering therapy with target LDL less than 70.  Echo 10/2020: 1. Left ventricular ejection fraction, by estimation, is 45 to 50%. The  left ventricle has low normal function. The left ventricle  demonstrates  global hypokinesis. There is mild left ventricular hypertrophy. Left  ventricular diastolic parameters are  consistent with Grade I diastolic dysfunction (impaired relaxation).   2. Right ventricular systolic function is normal. The right ventricular  size is normal. Tricuspid regurgitation signal is inadequate for assessing  PA pressure.   3. The mitral valve is normal in structure. No evidence of mitral valve   regurgitation. No evidence of mitral stenosis.   4. The aortic valve is tricuspid. There is mild calcification of the  aortic valve. There is mild thickening of the aortic valve. Aortic valve  regurgitation is not visualized. No aortic stenosis is present.   5. The inferior vena cava is dilated in size with >50% respiratory  variability, suggesting right atrial pressure of 8 mmHg.  Physical Exam:   VS:  BP 118/76 (BP Location: Left Arm, Cuff Size: Large)   Pulse (!) 56   Ht 6\' 1"  (1.854 m)   Wt 245 lb 12.8 oz (111.5 kg)   SpO2 100%   BMI 32.43 kg/m    Wt Readings from Last 3 Encounters:  06/30/23 245 lb 12.8 oz (111.5 kg)  05/07/23 236 lb 1.8 oz (107.1 kg)  12/31/22 236 lb 3.2 oz (107.1 kg)    GEN: Well nourished, well developed in no acute distress NECK: No JVD; No carotid bruits CARDIAC: S1/S2, RRR, no murmurs, rubs, gallops RESPIRATORY:  Clear to auscultation without rales, wheezing or rhonchi  ABDOMEN: Soft, non-tender, non-distended EXTREMITIES:  No edema; No deformity   ASSESSMENT AND PLAN: .    CAD Stable with no anginal symptoms. No indication for ischemic evaluation.  Continue aspirin , atorvastatin , losartan , and nitroglycerin  as needed. Heart healthy diet and regular cardiovascular exercise encouraged.  Care and ED precautions discussed.  HTN Blood pressure stable. Discussed to monitor BP at home at least 2 hours after medications and sitting for 5-10 minutes.  No medication changes at this time. Heart healthy diet and regular cardiovascular exercise encouraged.   HLD LDL 41 seen with last check. Continue atorvastatin . Heart healthy diet and regular cardiovascular exercise encouraged.  Continue to follow with PCP. Will request most recent labs from PCP's office and will refill atorvastatin  per his request.   Ascending aortic aneurysm CT scan 02/2022 revealed 4.2 cm ascending thoracic aorta with no significant interval change.  Recommended annual imaging follow-up.   This is being managed by cardiothoracic surgery.  Continue to follow-up with cardiothoracic surgery. Care and ED precautions discussed.   Tobacco abuse Smoking cessation encouraged and discussed.   6. Leg cramps Unclear etiology. Will request most recent labs from PCP. No indication for DVT. If no improvement by next office visit, consider ABI's.    Dispo: Follow-up with Dr. Armida Lander or APP in 6 months or sooner if anything changes.  Signed, Lasalle Pointer, NP

## 2023-08-20 ENCOUNTER — Ambulatory Visit: Payer: Self-pay | Admitting: Nurse Practitioner

## 2024-01-05 ENCOUNTER — Encounter: Payer: Self-pay | Admitting: Nurse Practitioner

## 2024-01-05 ENCOUNTER — Ambulatory Visit: Attending: Nurse Practitioner | Admitting: Nurse Practitioner

## 2024-01-05 VITALS — BP 136/79 | HR 69 | Ht 73.0 in | Wt 240.8 lb

## 2024-01-05 DIAGNOSIS — Z720 Tobacco use: Secondary | ICD-10-CM | POA: Diagnosis present

## 2024-01-05 DIAGNOSIS — I7121 Aneurysm of the ascending aorta, without rupture: Secondary | ICD-10-CM | POA: Insufficient documentation

## 2024-01-05 DIAGNOSIS — F1910 Other psychoactive substance abuse, uncomplicated: Secondary | ICD-10-CM | POA: Diagnosis present

## 2024-01-05 DIAGNOSIS — I10 Essential (primary) hypertension: Secondary | ICD-10-CM | POA: Diagnosis present

## 2024-01-05 DIAGNOSIS — I2510 Atherosclerotic heart disease of native coronary artery without angina pectoris: Secondary | ICD-10-CM | POA: Insufficient documentation

## 2024-01-05 DIAGNOSIS — E785 Hyperlipidemia, unspecified: Secondary | ICD-10-CM | POA: Insufficient documentation

## 2024-01-05 NOTE — Progress Notes (Signed)
 Cardiology Office Note:  .   Date:  01/05/2024 ID:  Matthew Dickerson, DOB Jul 17, 1955, MRN [id number] PCP: Shona Norleen PEDLAR, MD  [facility] Providers Cardiologist:  Alvan Carrier, MD    History of Present Illness: .   Matthew Dickerson is a 68 y.o. male with a PMH of CAD, history of NSTEMI in 2022, hypertension, hyperlipidemia, polysubstance abuse, and ascending aortic aneurysm, followed by cardiothoracic surgery), who presents today for 19-month follow-up appointment.  Was diagnosed as NSTEMI 2022.  Was having chest pain in the setting of cocaine/marijuana use.  EKG revealed right bundle branch block with inferior/lateral ST depression.  See cath report noted below, at that time he received a drug-eluting stent to the LAD.  EF at that time 45 to 50%, grade 1 DD.  Last seen by Dr. Carrier Alvan on May 06, 2022.  Brilinta  was discontinued as he completed over 1 year on DAPT.  Overall he was doing well.  12/31/2022 - Today he presents for 2-month follow-up appointment.  He states he is doing well from a cardiac perspective. Does admit to some MSK issues.  Does admit that 2 months ago he had an episode of chest pain after working outside almost all day says he overdid it and was dehydrated, took a nitroglycerin , rested and drink fluids, and stated symptoms were relieved.  Denies any recurrence.  Wife admits to patient having night sweats, patient states that this has been chronic for him.  Currently smokes half pack per day to 1 PPD. Denies any  shortness of breath, palpitations, syncope, presyncope, dizziness, orthopnea, PND, swelling or significant weight changes, acute bleeding, or claudication.  06/30/2023 - Doing well. Denies any acute cardiac complaints or issues. Denies any chest pain, shortness of breath, palpitations, syncope, presyncope, dizziness, orthopnea, PND, swelling or significant weight changes, acute bleeding, or claudication. Does admit to some occasional leg cramps.    01/05/2024 - Here for follow-up.  Overall doing well from a cardiac perspective.  Admits to issues with sciatic nerve pain, what was causing his occasional leg cramps at last office visit.  Does admit to some recent stress.  Denies any chest pain, shortness of breath, palpitations, syncope, presyncope, dizziness, orthopnea, PND, swelling or significant weight changes, acute bleeding, or claudication.  Does tell me he has done cocaine twice this past year, used to help his erectile dysfunction.  Smokes half pack of cigarettes per day and is cutting back on his marijuana use.  Denies any alcohol use.  ROS: Negative. See HPI.   Studies Reviewed: SABRA     EKG:  EKG Interpretation Date/Time:  Thursday January 05 2024 11:36:07 EST Ventricular Rate:  78 PR Interval:  154 QRS Duration:  162 QT Interval:  434 QTC Calculation: 494 R Axis:   -65  Text Interpretation: Sinus rhythm with Premature atrial complexes Right bundle branch block Left anterior fascicular block Bifascicular block Minimal voltage criteria for LVH, may be normal variant ( R in aVL ) When compared with ECG of 31-Dec-2022 11:28, No significant change was found Confirmed by Miriam Norris [phone]) on 01/05/2024 11:37:53 AM   LHC 10/2020:    Mid LM lesion is 20% stenosed.   Prox LAD lesion is 80% stenosed.   Prox LAD to Mid LAD lesion is 70% stenosed.   A stent was successfully placed.   Post intervention, there is a 0% residual stenosis.   Post intervention, there is a 0% residual stenosis.   Mild 20% smooth distal left main  stenosis prior to trifurcation into a large LAD, ramus intermediate vessel, and small left circumflex vessel.     The LAD had proximal calcification with 80% eccentric smooth proximal stenosis before small first diagonal branch followed by irregularity and 70% stenosis beyond this diagonal vessel.   The ramus intermediate vessel was large caliber bifurcating vessel which was angiographically normal.   The  left circumflex vessel was small caliber and normal.   The RCA was angiographically normal.   Successful PCI to the LAD with ultimate insertion of a 3.0 x 32 mm Synergy DES stent to cover both lesions postdilated to 3.29 mm with the stenoses being reduced to 0%.   LVEDP: 9 mmHg   RECOMMENDATION:  DAPT for minimum of 1 year.  Continue medical therapy for treatment of potential spasm and optimal blood pressure control.  The patient must discontinue drug use.  Aggressive lipid-lowering therapy with target LDL less than 70.  Echo 10/2020: 1. Left ventricular ejection fraction, by estimation, is 45 to 50%. The  left ventricle has low normal function. The left ventricle demonstrates  global hypokinesis. There is mild left ventricular hypertrophy. Left  ventricular diastolic parameters are  consistent with Grade I diastolic dysfunction (impaired relaxation).   2. Right ventricular systolic function is normal. The right ventricular  size is normal. Tricuspid regurgitation signal is inadequate for assessing  PA pressure.   3. The mitral valve is normal in structure. No evidence of mitral valve  regurgitation. No evidence of mitral stenosis.   4. The aortic valve is tricuspid. There is mild calcification of the  aortic valve. There is mild thickening of the aortic valve. Aortic valve  regurgitation is not visualized. No aortic stenosis is present.   5. The inferior vena cava is dilated in size with >50% respiratory  variability, suggesting right atrial pressure of 8 mmHg.  Physical Exam:   VS:  BP 136/79   Pulse 69   Ht 6' 1 (1.854 m)   Wt 240 lb 12.8 oz (109.2 kg)   SpO2 (!) 64%   BMI 31.77 kg/m    Wt Readings from Last 3 Encounters:  01/05/24 240 lb 12.8 oz (109.2 kg)  06/30/23 245 lb 12.8 oz (111.5 kg)  05/07/23 236 lb 1.8 oz (107.1 kg)    GEN: Well nourished, well developed in no acute distress NECK: No JVD; No carotid bruits CARDIAC: S1/S2, RRR, no murmurs, rubs,  gallops RESPIRATORY:  Clear to auscultation without rales, wheezing or rhonchi  ABDOMEN: Soft, non-tender, non-distended EXTREMITIES:  No edema; No deformity   ASSESSMENT AND PLAN: .    CAD Stable with no anginal symptoms. No indication for ischemic evaluation.  Continue aspirin , atorvastatin , losartan , and nitroglycerin  as needed. Heart healthy diet and regular cardiovascular exercise encouraged.  Care and ED precautions discussed.  HTN Blood pressure on recheck stable. Discussed to monitor BP at home at least 2 hours after medications and sitting for 5-10 minutes.  No medication changes at this time. Heart healthy diet and regular cardiovascular exercise encouraged.   HLD LDL 41 seen with last check. Continue atorvastatin . Heart healthy diet and regular cardiovascular exercise encouraged.  Continue to follow with PCP. Will request most recent labs from PCP's office and will refill atorvastatin  per his request.   Ascending aortic aneurysm CT scan 02/2022 revealed 4.2 cm ascending thoracic aorta with no significant interval change.  Recommended annual imaging follow-up.  This is being managed by cardiothoracic surgery.  I recommended that he continue to follow-up with  cardiothoracic surgery. Care and ED precautions discussed.   Tobacco abuse, substance abuse Smoking cessation encouraged and discussed.  Discussed risk of cocaine use as well as marijuana use.  Tells me he is no longer using cocaine. He verbalized understanding.    Dispo: Request most recent labs from his PCP.  Follow-up with Dr. Dorn Ross or APP in 1 year or sooner if anything changes.  Signed, Almarie Crate, NP

## 2024-01-05 NOTE — Patient Instructions (Addendum)
 Medication Instructions:  Your physician recommends that you continue on your current medications as directed. Please refer to the Current Medication list given to you today.   Labwork: Requesting labs from Primary Care Physician  Testing/Procedures: None  Follow-Up: Your physician recommends that you schedule a follow-up appointment in: 1 year. You will receive a reminder call in about 8 months reminding you to schedule your appointment. If you don't receive this call, please contact our office.   Any Other Special Instructions Will Be Listed Below (If Applicable). Thank you for choosing [facility]!     If you need a refill on your cardiac medications before your next appointment, please call your pharmacy.

## 2024-01-31 ENCOUNTER — Other Ambulatory Visit: Payer: Self-pay | Admitting: Surgery

## 2024-01-31 DIAGNOSIS — I7121 Aneurysm of the ascending aorta, without rupture: Secondary | ICD-10-CM

## 2024-02-06 ENCOUNTER — Ambulatory Visit (HOSPITAL_COMMUNITY): Admission: RE | Admit: 2024-02-06 | Discharge: 2024-02-06 | Attending: Surgery | Admitting: Surgery

## 2024-02-06 DIAGNOSIS — I7121 Aneurysm of the ascending aorta, without rupture: Secondary | ICD-10-CM | POA: Diagnosis present

## 2024-02-06 MED ORDER — IOHEXOL 350 MG/ML SOLN
75.0000 mL | Freq: Once | INTRAVENOUS | Status: AC | PRN
  Administered 2024-02-06: 15:00:00 75 mL via INTRAVENOUS

## 2024-02-29 ENCOUNTER — Ambulatory Visit

## 2024-02-29 VITALS — BP 132/78 | HR 87 | Resp 18 | Ht 73.0 in | Wt 242.0 lb

## 2024-02-29 DIAGNOSIS — I7121 Aneurysm of the ascending aorta, without rupture: Secondary | ICD-10-CM | POA: Diagnosis not present

## 2024-02-29 NOTE — Progress Notes (Addendum)
 "      [address] [place] [address]             [id number]            Matthew Dickerson [id number] 12/21/1955   History of Present Illness:  Matthew Dickerson is a 69 year old man with medical history of hypertension, CAD, lumbago with sciatica, osteoarthritis, and hyperlipidemia who presents for continued follow up of ascending thoracic aortic aneurysm.  Aneurysm has measured stable in size and on recent CTA of chest measured 4.2 cm. Echocardiogram in 2022 showed that the aortic valve is tricuspid and no aortic regurgitation or stenosis.   He presents to the clinic today and reports that he is doing well.  His blood pressure is well-controlled with current medication therapy.  He reports that he still smokes cigarettes and roughly smokes around 1 pack/week.  He has quit smoking marijuana.  He will be undergoing cataract surgery to help with his vision in the coming months.  Denies chest pain, shortness of breath and lower leg edema.    Medications Ordered Prior to Encounter[1]   ROS: Review of Systems  Constitutional:  Negative for chills, fever and malaise/fatigue.  Respiratory:  Negative for cough, shortness of breath and wheezing.   Cardiovascular:  Negative for chest pain, palpitations and leg swelling.     BP 132/78 (BP Location: Right Arm)   Pulse 87   Resp 18   Ht 6' 1 (1.854 m)   Wt 242 lb (109.8 kg)   SpO2 97%   BMI 31.93 kg/m   Physical Exam Constitutional:      Appearance: Normal appearance.  HENT:     Head: Normocephalic and atraumatic.  Cardiovascular:     Rate and Rhythm: Normal rate and regular rhythm.     Heart sounds: Normal heart sounds, S1 normal and S2 normal.  Pulmonary:     Effort: Pulmonary effort is normal.     Breath sounds: Normal breath sounds.  Skin:    General: Skin is warm and dry.  Neurological:     General: No focal deficit present.     Mental Status: He is alert and oriented to person, place, and time.       Imaging: CLINICAL DATA:  Pelvic aneurysm suspected.   EXAM: CT ANGIOGRAPHY CHEST WITH CONTRAST   TECHNIQUE: Multidetector CT imaging of the chest was performed using the standard protocol during bolus administration of intravenous contrast. Multiplanar CT image reconstructions and MIPs were obtained to evaluate the vascular anatomy.   RADIATION DOSE REDUCTION: This exam was performed according to the departmental dose-optimization program which includes automated exposure control, adjustment of the mA and/or kV according to patient size and/or use of iterative reconstruction technique.   CONTRAST:  75mL OMNIPAQUE  IOHEXOL  350 MG/ML SOLN   COMPARISON:  Chest CT dated 03/05/2022.   FINDINGS: Cardiovascular: There is no cardiomegaly or pericardial effusion. Advanced coronary vascular calcification. Mildly dilated ascending aorta measures 4.2 cm in diameter similar to prior CT. No aortic dissection. There is mild atherosclerotic calcification of the thoracic aorta. The origins of the great vessels of the aortic arch are patent. No pulmonary artery embolus identified.   Mediastinum/Nodes: No hilar or mediastinal adenopathy. The esophagus and the thyroid gland are grossly unremarkable. No mediastinal fluid collection.   Lungs/Pleura: Mild paraseptal emphysema. No focal consolidation, pleural effusion or pneumothorax. A 4 mm nodular thickening along the minor fissure (146/601). The central airways are  patent.   Upper Abdomen: No acute abnormality.   Musculoskeletal: Degenerative changes of the spine. No acute osseous pathology.   Review of the MIP images confirms the above findings.   IMPRESSION: 1. No acute intrathoracic pathology. No CT evidence of pulmonary artery embolus. 2. Stable 4.2 cm aneurysm of the ascending aorta. Recommend annual imaging followup by CTA or MRA. This recommendation follows 2010 ACCF/AHA/AATS/ACR/ASA/SCA/SCAI/SIR/STS/SVM Guidelines for  the Diagnosis and Management of Patients with Thoracic Aortic Disease. Circulation. 2010; 121: [id number]. Aortic aneurysm NOS (ICD10-I71.9)   3. Aortic Atherosclerosis (ICD10-I70.0) and Emphysema (ICD10-J43.9).     Electronically Signed   By: Vanetta Chou M.D.   On: 02/06/2024 15:02     A/P:  Aneurysm of ascending aorta without rupture -4.2 cm ascending thoracic aortic aneurysm on recent CTA of chest.  Echocardiogram from 10/2020 showed that the aortic valve was tricuspid.  The aortic valve had mild calcification but no aortic regurgitation or aortic stenosis. -We discussed the natural history and and risk factors for growth of ascending aortic aneurysms. Discussed recommendations to minimize the risk of further expansion or dissection including careful blood pressure control, avoidance of contact sports and heavy lifting, attention to lipid management.  We covered the importance of smoking cessation.  The patient does not yet meet surgical criteria of >5.5cm. The patient is aware of signs and symptoms of aortic dissection and when to present to the emergency department   -Follow up in one year with CTA of chest for continued surveillance  Risk Modification:  Statin:  atorvastatin   Smoking cessation instruction/counseling given:  counseled patient on the dangers of tobacco use, advised patient to stop smoking, and reviewed strategies to maximize success  Patient was counseled on importance of Blood Pressure Control  They are instructed to contact their Primary Care Physician if they start to have blood pressure readings over 130s/90s. Do not ever stop blood pressure medications on your own, unless instructed by healthcare professional.  Please avoid use of Fluoroquinolones as this can potentially increase your risk of Aortic Rupture and/or Dissection  Patient educated on signs and symptoms of Aortic Dissection, handout also provided in AVS  Matthew CHRISTELLA Rough, PA-C 02/29/2024      [1]  Current Outpatient Medications on File Prior to Visit  Medication Sig Dispense Refill   aspirin  81 MG EC tablet Take 1 tablet (81 mg total) by mouth daily. Swallow whole. 30 tablet 11   atorvastatin  (LIPITOR ) 80 MG tablet Take 1 tablet (80 mg total) by mouth daily. 90 tablet 3   losartan  (COZAAR ) 25 MG tablet Take 1 tablet (25 mg total) by mouth daily. 30 tablet 3   nitroGLYCERIN  (NITROSTAT ) 0.4 MG SL tablet Place 1 tablet (0.4 mg total) under the tongue every 5 (five) minutes x 3 doses as needed for chest pain (if no relief after 2nd dose, proceed to the ED or call 911). 25 tablet 2   omeprazole  (PRILOSEC  OTC) 20 MG tablet Take 20 mg by mouth daily.     omeprazole  (PRILOSEC ) 20 MG capsule Take 20 mg by mouth daily.     No current facility-administered medications on file prior to visit.   "

## 2024-03-02 NOTE — Patient Instructions (Signed)

## 2024-03-12 ENCOUNTER — Ambulatory Visit: Payer: Self-pay | Admitting: Nurse Practitioner

## 2024-03-20 ENCOUNTER — Other Ambulatory Visit: Payer: Self-pay | Admitting: Cardiology

## 2024-03-22 ENCOUNTER — Other Ambulatory Visit: Payer: Self-pay | Admitting: Cardiology

## 2024-03-26 MED ORDER — LOSARTAN POTASSIUM 25 MG PO TABS: 25.0000 mg | ORAL_TABLET | Freq: Every day | ORAL | 2 refills | Status: DC

## 2024-03-27 MED ORDER — LOSARTAN POTASSIUM 25 MG PO TABS: 25.0000 mg | ORAL_TABLET | Freq: Every day | ORAL | 3 refills | Status: AC

## 2024-03-27 MED ORDER — ATORVASTATIN CALCIUM 80 MG PO TABS: 80.0000 mg | ORAL_TABLET | Freq: Every day | ORAL | 3 refills | Status: AC
